# Patient Record
Sex: Male | Born: 1975 | Race: Black or African American | Hispanic: No | Marital: Single
Health system: Southern US, Community
[De-identification: ages and names within clinical notes are randomized; demographics above are authoritative.]

## PROBLEM LIST (undated history)

## (undated) DIAGNOSIS — F1910 Other psychoactive substance abuse, uncomplicated: Secondary | ICD-10-CM

## (undated) DIAGNOSIS — Z8719 Personal history of other diseases of the digestive system: Secondary | ICD-10-CM

## (undated) DIAGNOSIS — N433 Hydrocele, unspecified: Secondary | ICD-10-CM

## (undated) DIAGNOSIS — Z789 Other specified health status: Secondary | ICD-10-CM

## (undated) HISTORY — PX: NO PAST SURGERIES: SHX2092

---

## 1998-03-29 ENCOUNTER — Emergency Department (HOSPITAL_COMMUNITY): Admission: EM | Admit: 1998-03-29 | Discharge: 1998-03-29 | Payer: Self-pay | Admitting: Emergency Medicine

## 2001-03-02 ENCOUNTER — Emergency Department (HOSPITAL_COMMUNITY): Admission: EM | Admit: 2001-03-02 | Discharge: 2001-03-02 | Payer: Self-pay | Admitting: Emergency Medicine

## 2003-04-14 ENCOUNTER — Emergency Department (HOSPITAL_COMMUNITY): Admission: EM | Admit: 2003-04-14 | Discharge: 2003-04-14 | Payer: Self-pay | Admitting: Emergency Medicine

## 2003-06-01 ENCOUNTER — Emergency Department (HOSPITAL_COMMUNITY): Admission: EM | Admit: 2003-06-01 | Discharge: 2003-06-02 | Payer: Self-pay | Admitting: Emergency Medicine

## 2003-06-02 ENCOUNTER — Emergency Department (HOSPITAL_COMMUNITY): Admission: EM | Admit: 2003-06-02 | Discharge: 2003-06-02 | Payer: Self-pay | Admitting: Emergency Medicine

## 2005-12-04 ENCOUNTER — Emergency Department (HOSPITAL_COMMUNITY): Admission: EM | Admit: 2005-12-04 | Discharge: 2005-12-04 | Payer: Self-pay | Admitting: Emergency Medicine

## 2006-06-12 ENCOUNTER — Emergency Department (HOSPITAL_COMMUNITY): Admission: EM | Admit: 2006-06-12 | Discharge: 2006-06-12 | Payer: Self-pay | Admitting: Family Medicine

## 2011-03-30 ENCOUNTER — Emergency Department (HOSPITAL_COMMUNITY)
Admission: EM | Admit: 2011-03-30 | Discharge: 2011-03-30 | Disposition: A | Discharge status: Home / Self Care | Payer: Self-pay | Attending: Emergency Medicine | Admitting: Emergency Medicine

## 2011-03-30 DIAGNOSIS — J3489 Other specified disorders of nose and nasal sinuses: Secondary | ICD-10-CM | POA: Insufficient documentation

## 2011-03-30 DIAGNOSIS — J309 Allergic rhinitis, unspecified: Secondary | ICD-10-CM | POA: Insufficient documentation

## 2011-03-30 DIAGNOSIS — R51 Headache: Secondary | ICD-10-CM | POA: Insufficient documentation

## 2011-03-30 DIAGNOSIS — F1410 Cocaine abuse, uncomplicated: Secondary | ICD-10-CM | POA: Insufficient documentation

## 2011-04-27 ENCOUNTER — Emergency Department (HOSPITAL_COMMUNITY)
Admission: EM | Admit: 2011-04-27 | Discharge: 2011-04-27 | Disposition: A | Discharge status: Home / Self Care | Payer: Self-pay | Attending: Emergency Medicine | Admitting: Emergency Medicine

## 2011-04-27 DIAGNOSIS — L0231 Cutaneous abscess of buttock: Secondary | ICD-10-CM | POA: Insufficient documentation

## 2011-04-27 DIAGNOSIS — L03317 Cellulitis of buttock: Secondary | ICD-10-CM | POA: Insufficient documentation

## 2011-04-27 DIAGNOSIS — IMO0001 Reserved for inherently not codable concepts without codable children: Secondary | ICD-10-CM | POA: Insufficient documentation

## 2013-01-01 ENCOUNTER — Emergency Department (INDEPENDENT_AMBULATORY_CARE_PROVIDER_SITE_OTHER)
Admission: EM | Admit: 2013-01-01 | Discharge: 2013-01-01 | Disposition: A | Discharge status: Home / Self Care | Payer: Self-pay | Source: Home / Self Care | Attending: Family Medicine | Admitting: Family Medicine

## 2013-01-01 ENCOUNTER — Encounter (HOSPITAL_COMMUNITY): Payer: Self-pay

## 2013-01-01 DIAGNOSIS — K2970 Gastritis, unspecified, without bleeding: Secondary | ICD-10-CM

## 2013-01-01 DIAGNOSIS — K644 Residual hemorrhoidal skin tags: Secondary | ICD-10-CM

## 2013-01-01 LAB — OCCULT BLOOD, POC DEVICE: Fecal Occult Bld: NEGATIVE

## 2013-01-01 MED ORDER — OMEPRAZOLE 20 MG PO CPDR: 20.0000 mg | DELAYED_RELEASE_CAPSULE | Freq: Every day | ORAL | Status: DC

## 2013-01-01 NOTE — ED Provider Notes (Signed)
History     CSN: [id number]  Arrival date & time 01/01/13  1026   First MD Initiated Contact with Patient 01/01/13 1153      Chief Complaint  Patient presents with  . Rectal Bleeding    (Consider location/radiation/quality/duration/timing/severity/associated sxs/prior treatment) HPI Comments: 37 year old male with no significant past medical history here complaining of intermittent rectal bleeding when he wipes after stooling during the last week.  Poor historian, states  symptoms associated with abdominal fullness sensation, hard stools initially and then intermittent loose stools and pain in his stomach. Denies nausea, vomiting, denies hematemesis. No melena. No jaundice. Patient reports that he had alcohol intake more than what is usual for him on the days prior he started with diarrhea and abdominal discomfort. He also smokes cigarettes and marijuana daily. Has not taken any medications for his symptoms. Reports normal bowel movements last time yesterday with no blood.  states he feels well today denies  fever, chills, abdominal pain, nausea or vomiting. Denies family history of Crohn's disease or ulcerative colitis. Denies personal history of ulcers.    History reviewed. No pertinent past medical history.  History reviewed. No pertinent past surgical history.  History reviewed. No pertinent family history.  History  Substance Use Topics  . Smoking status: Not on file  . Smokeless tobacco: Not on file  . Alcohol Use: No      Review of Systems  Constitutional: Negative for fever, chills, diaphoresis, activity change, appetite change and fatigue.  Gastrointestinal: Positive for diarrhea, constipation, blood in stool and hematochezia. Negative for nausea, vomiting, abdominal pain and anal bleeding.  Genitourinary: Negative for dysuria and frequency.  Skin: Negative for rash.  Neurological: Negative for dizziness and headaches.  All other systems reviewed and are  negative.    Allergies  Review of patient's allergies indicates no known allergies.  Home Medications   Current Outpatient Rx  Name  Route  Sig  Dispense  Refill  . OMEPRAZOLE 20 MG PO CPDR   Oral   Take 1 capsule (20 mg total) by mouth daily.   30 capsule   0     BP 103/58  Pulse 56  Temp 99 F (37.2 C) (Oral)  Resp 16  Ht 6\' 4"  (1.93 m)  Wt 175 lb (79.379 kg)  BMI 21.30 kg/m2  SpO2 99%  Physical Exam  Nursing note and vitals reviewed. Constitutional: He is oriented to person, place, and time. He appears well-developed and well-nourished. No distress.  HENT:  Head: Normocephalic and atraumatic.  Mouth/Throat: Oropharynx is clear and moist. No oropharyngeal exudate.  Eyes: Conjunctivae normal are normal. No scleral icterus.  Neck: Neck supple. No thyromegaly present.  Cardiovascular: Normal heart sounds.   Pulmonary/Chest: Breath sounds normal.  Abdominal: Soft. Bowel sounds are normal. He exhibits no distension and no mass. There is no rebound and no guarding.       Reported mild discomfort with deep palpation of the epigastric area. Otherwise normal abdominal exam. No hepatosplenomegaly. Abdomen is flat with no distention or ascites  Genitourinary: Guaiac negative stool.       Rectum: External hemorrhoids. No obvious fissures. Rectal sphincter with normal tone. I don't feel rectal mass or internal hemorrhoids. No stools in rectal vault.  Lymphadenopathy:    He has no cervical adenopathy.  Neurological: He is alert and oriented to person, place, and time.  Skin: No rash noted. He is not diaphoretic.    ED Course  Procedures (including critical care time)   Labs  Reviewed  OCCULT BLOOD, POC DEVICE   No results found.   1. Hemorrhoids, external   2. Gastritis       MDM  Normal physical/abdominal exam today other than confirmation of external hemorrhoids.  no obvious rectal fissure.negative guaiac. My impression is that he had irritated external  hemorrhoids and gastritis associated with chronic  constipation. Recommended MiraLax when he has hard stools. Encouraged to quit smoking and avoid alcohol intake.  Supportive care and red flags that should prompt his return to medical attention discussed with patient and provided in writing. Gastroenterologist referral as needed.        Sharin Grave, MD 01/02/13 [phone]

## 2013-01-01 NOTE — ED Notes (Signed)
C/o abdominal area fullness (undetermined # of days), hard stool, and blood from rectal area; NAD

## 2014-05-20 ENCOUNTER — Emergency Department (INDEPENDENT_AMBULATORY_CARE_PROVIDER_SITE_OTHER)
Admission: EM | Admit: 2014-05-20 | Discharge: 2014-05-20 | Disposition: A | Discharge status: Home / Self Care | Payer: Self-pay | Source: Home / Self Care | Attending: Family Medicine | Admitting: Family Medicine

## 2014-05-20 ENCOUNTER — Other Ambulatory Visit (HOSPITAL_COMMUNITY)
Admission: RE | Admit: 2014-05-20 | Discharge: 2014-05-20 | Disposition: A | Discharge status: Home / Self Care | Payer: Self-pay | Source: Ambulatory Visit | Attending: Family Medicine | Admitting: Family Medicine

## 2014-05-20 ENCOUNTER — Encounter (HOSPITAL_COMMUNITY): Payer: Self-pay | Admitting: Emergency Medicine

## 2014-05-20 DIAGNOSIS — Z202 Contact with and (suspected) exposure to infections with a predominantly sexual mode of transmission: Secondary | ICD-10-CM

## 2014-05-20 DIAGNOSIS — Z113 Encounter for screening for infections with a predominantly sexual mode of transmission: Secondary | ICD-10-CM | POA: Insufficient documentation

## 2014-05-20 DIAGNOSIS — N433 Hydrocele, unspecified: Secondary | ICD-10-CM

## 2014-05-20 MED ORDER — CEFTRIAXONE SODIUM 250 MG IJ SOLR
250.0000 mg | Freq: Once | INTRAMUSCULAR | Status: AC
  Administered 2014-05-20: 250 mg via INTRAMUSCULAR

## 2014-05-20 MED ORDER — AZITHROMYCIN 250 MG PO TABS
1000.0000 mg | ORAL_TABLET | Freq: Once | ORAL | Status: AC
  Administered 2014-05-20: 1000 mg via ORAL

## 2014-05-20 MED ORDER — AZITHROMYCIN 250 MG PO TABS
ORAL_TABLET | ORAL | Status: AC
  Filled 2014-05-20: qty 4

## 2014-05-20 MED ORDER — LIDOCAINE HCL (PF) 1 % IJ SOLN
INTRAMUSCULAR | Status: AC
Start: 2014-05-20 — End: 2014-05-20
  Filled 2014-05-20: qty 5

## 2014-05-20 MED ORDER — CEFTRIAXONE SODIUM 250 MG IJ SOLR
INTRAMUSCULAR | Status: AC
  Filled 2014-05-20: qty 250

## 2014-05-20 NOTE — Discharge Instructions (Signed)
Thank you for coming in today. Come back as needed.  I recommend a HIV and syphilis test at the health department in the near future.   Hydrocele, Adult Fluid can collect around the testicles. This fluid forms in a sac. This condition is called a hydrocele. The collected fluid causes swelling of the scrotum. Usually, it affects just one testicle. Most of the time, the condition does not cause pain. Sometimes, the hydrocele goes away on its own. Other times, surgery is needed to get rid of the fluid. CAUSES A hydrocele does not develop often. Different things can cause a hydrocele in a man, including:  Injury to the scrotum.  Infection.  X-ray of the area around the scrotum.  A tumor or cancer of the testicle.  Twisting of a testicle.  Decreased blood flow to the scrotum. SYMPTOMS   Swelling without pain. The hydrocele feels like a water-filled balloon.  Swelling with pain. This can occur if the hydrocele was caused by infection or twisting.  Mild discomfort in the scrotum.  The hydrocele may feel heavy.  Swelling that gets smaller when you lie down. DIAGNOSIS  Your caregiver will do a physical exam to decide if you have a hydrocele. This may include:  Asking questions about your overall health, today and in the past. Your caregiver may ask about any injuries, X-rays, or infections.  Pushing on your abdomen or asking you to change positions to see if the size of the hydrocele changes.  Shining a light through the scrotum (transillumination) to see if the fluid inside the scrotum is clear.  Blood tests and urine tests to check for infection.  Imaging studies that take pictures of the scrotum and testicles. TREATMENT  Treatment depends in part on what caused the condition. Options include:  Watchful waiting. Your caregiver checks the hydrocele every so often.  Different surgeries to drain the fluid.  A needle may be put into the scrotum to drain fluid (needle  aspiration). Fluid often returns after this type of treatment.  A cut (incision) may be made in the scrotum to remove the fluid sac (hydrocelectomy).  An incision may be made in the groin to repair a hydrocele that has contact with abdominal fluids (communicating hydrocele).  Medicines to treat an infection (antibiotics). HOME CARE INSTRUCTIONS  What you need to do at home may depend on the cause of the hydrocele and type of treatment. In general:  Take all medicine as directed by your caregiver. Follow the directions carefully.  Ask your caregiver if there is anything you should not do while you recover (activities, lifting, work, sex).  If you had surgery to repair a communicating hydrocele, recovery time may vary. Ask you caregiver about your recovery time.  Avoid heavy lifting for 4 to 6 weeks.  If you had an incision on the scrotum or groin, wash it for 2 to 3 days after surgery. Do this as long as the skin is closed and there are no gaps in the wound. Wash gently, and avoid rubbing the incision.  Keep all follow-up appointments. SEEK MEDICAL CARE IF:   Your scrotum seems to be getting larger.  The area becomes more and more uncomfortable. SEEK IMMEDIATE MEDICAL CARE IF:  You have a fever. Document Released: 05/03/2010 Document Revised: 09/03/2013 Document Reviewed: 05/03/2010 [facility] Patient Information 2015 [place], [place]. This information is not intended to replace advice given to you by your health care provider. Make sure you discuss any questions you have with your health  care provider.  Sexually Transmitted Disease A sexually transmitted disease (STD) is a disease or infection that may be passed (transmitted) from person to person, usually during sexual activity. This may happen by way of saliva, semen, blood, vaginal mucus, or urine. Common STDs include:   Gonorrhea.   Chlamydia.   Syphilis.   HIV and AIDS.   Genital herpes.   Hepatitis B and C.    Trichomonas.   Human papillomavirus (HPV).   Pubic lice.   Scabies.  Mites.  Bacterial vaginosis. WHAT ARE CAUSES OF STDs? An STD may be caused by bacteria, a virus, or parasites. STDs are often transmitted during sexual activity if one person is infected. However, they may also be transmitted through nonsexual means. STDs may be transmitted after:   Sexual intercourse with an infected person.   Sharing sex toys with an infected person.   Sharing needles with an infected person or using unclean piercing or tattoo needles.  Having intimate contact with the genitals, mouth, or rectal areas of an infected person.   Exposure to infected fluids during birth. WHAT ARE THE SIGNS AND SYMPTOMS OF STDs? Different STDs have different symptoms. Some people may not have any symptoms. If symptoms are present, they may include:   Painful or bloody urination.   Pain in the pelvis, abdomen, vagina, anus, throat, or eyes.   A skin rash, itching, or irritation.  Growths, ulcerations, blisters, or sores in the genital and anal areas.  Abnormal vaginal discharge with or without bad odor.   Penile discharge in men.   Fever.   Pain or bleeding during sexual intercourse.   Swollen glands in the groin area.   Yellow skin and eyes (jaundice). This is seen with hepatitis.   Swollen testicles.  Infertility.  Sores and blisters in the mouth. HOW ARE STDs DIAGNOSED? To make a diagnosis, your health care provider may:   Take a medical history.   Perform a physical exam.   Take a sample of any discharge to examine.  Swab the throat, cervix, opening to the penis, rectum, or vagina for testing.  Test a sample of your first morning urine.   Perform blood tests.   Perform a Pap test, if this applies.   Perform a colposcopy.   Perform a laparoscopy.  HOW ARE STDs TREATED? Treatment depends on the STD. Some STDs may be treated but not cured.   Chlamydia,  gonorrhea, trichomonas, and syphilis can be cured with antibiotic medicine.   Genital herpes, hepatitis, and HIV can be treated, but not cured, with prescribed medicines. The medicines lessen symptoms.   Genital warts from HPV can be treated with medicine or by freezing, burning (electrocautery), or surgery. Warts may come back.   HPV cannot be cured with medicine or surgery. However, abnormal areas may be removed from the cervix, vagina, or vulva.   If your diagnosis is confirmed, your recent sexual partners need treatment. This is true even if they are symptom-free or have a negative culture or evaluation. They should not have sex until their health care providers say it is okay. HOW CAN I REDUCE MY RISK OF GETTING AN STD? Take these steps to reduce your risk of getting an STD:  Use latex condoms, dental dams, and water-soluble lubricants during sexual activity. Do not use petroleum jelly or oils.  Avoid having multiple sex partners.  Do not have sex with someone who has other sex partners.  Do not have sex with anyone you do not know  or who is at high risk for an STD.  Avoid risky sex practices that can break your skin.  Do not have sex if you have open sores on your mouth or skin.  Avoid drinking too much alcohol or taking illegal drugs. Alcohol and drugs can affect your judgment and put you in a vulnerable position.  Avoid engaging in oral and anal sex acts.  Get vaccinated for HPV and hepatitis. If you have not received these vaccines in the past, talk to your health care provider about whether one or both might be right for you.   If you are at risk of being infected with HIV, it is recommended that you take a prescription medicine daily to prevent HIV infection. This is called pre-exposure prophylaxis (PrEP). You are considered at risk if:  You are a man who has sex with other men (MSM).  You are a heterosexual man or woman and are sexually active with more than one  partner.  You take drugs by injection.  You are sexually active with a partner who has HIV.  Talk with your health care provider about whether you are at high risk of being infected with HIV. If you choose to begin PrEP, you should first be tested for HIV. You should then be tested every 3 months for as long as you are taking PrEP.  WHAT SHOULD I DO IF I THINK I HAVE AN STD?  See your health care provider.   Tell your sexual partner(s). They should be tested and treated for any STDs.  Do not have sex until your health care provider says it is okay. WHEN SHOULD I GET IMMEDIATE MEDICAL CARE? Contact your health care provider right away if:   You have severe abdominal pain.  You are a man and notice swelling or pain in your testicles.  You are a woman and notice swelling or pain in your vagina. Document Released: 02/03/2003 Document Revised: 11/18/2013 Document Reviewed: 06/03/2013 [facility] Patient Information 2015 [place], [place]. This information is not intended to replace advice given to you by your health care provider. Make sure you discuss any questions you have with your health care provider.

## 2014-05-20 NOTE — ED Notes (Signed)
Reports possible exposure to std.  Requesting std check.  Pt is asymptomatic.

## 2014-05-20 NOTE — ED Provider Notes (Signed)
Edward BeckwithRichard L Bond is a 38 y.o. male who presents to Urgent Care today for exposure to STD. Patient was informed that a sexual partner of his was tested positive for Chlamydia. He denies any symptoms and feels well otherwise. No discharge fevers chills nausea vomiting or diarrhea. He feels well otherwise.   History reviewed. No pertinent past medical history. History  Substance Use Topics  . Smoking status: Current Every Day Smoker  . Smokeless tobacco: Not on file  . Alcohol Use: No   ROS as above Medications: No current facility-administered medications for this encounter.   Current Outpatient Prescriptions  Medication Sig Dispense Refill  . omeprazole (PRILOSEC) 20 MG capsule Take 1 capsule (20 mg total) by mouth daily.  30 capsule  0    Exam:  BP 115/67  Pulse 55  Temp(Src) 98.4 F (36.9 C) (Oral)  Resp 16  SpO2 100% Gen: Well NAD Genital: No inguinal lymphadenopathy. Testicles are descended bilaterally and nontender with no masses. Right hydrocele is present. Penis is normal appearing and circumcised. No lesions or discharge.  No results found for this or any previous visit (from the past 24 hour(s)). No results found.  Assessment and Plan: 38 y.o. male with hydrocele with STD exposure. Watchful waiting for hydrocele. Urine cytology pending. Empiric treatment with azithromycin and ceftriaxone. Patient declined HIV and syphilis testing.  Discussed warning signs or symptoms. Please see discharge instructions. Patient expresses understanding.    Edward BongEvan S Corey, MD 05/20/14 [phone]

## 2017-01-29 ENCOUNTER — Ambulatory Visit (HOSPITAL_COMMUNITY)
Admission: EM | Admit: 2017-01-29 | Discharge: 2017-01-29 | Disposition: A | Discharge status: Home / Self Care | Payer: Self-pay | Attending: Internal Medicine | Admitting: Internal Medicine

## 2017-01-29 ENCOUNTER — Encounter (HOSPITAL_COMMUNITY): Payer: Self-pay | Admitting: Family Medicine

## 2017-01-29 DIAGNOSIS — L0291 Cutaneous abscess, unspecified: Secondary | ICD-10-CM

## 2017-01-29 MED ORDER — ONDANSETRON 4 MG PO TBDP
4.0000 mg | ORAL_TABLET | Freq: Once | ORAL | Status: AC
  Administered 2017-01-29: 4 mg via ORAL

## 2017-01-29 MED ORDER — ACETAMINOPHEN-CODEINE #3 300-30 MG PO TABS: 1.0000 | ORAL_TABLET | Freq: Four times a day (QID) | ORAL | 0 refills | Status: DC | PRN

## 2017-01-29 MED ORDER — ONDANSETRON 4 MG PO TBDP
ORAL_TABLET | ORAL | Status: AC
  Filled 2017-01-29: qty 1

## 2017-01-29 MED ORDER — SULFAMETHOXAZOLE-TRIMETHOPRIM 800-160 MG PO TABS: 1.0000 | ORAL_TABLET | Freq: Two times a day (BID) | ORAL | 0 refills | Status: AC

## 2017-01-29 MED ORDER — IBUPROFEN 800 MG PO TABS: 800.0000 mg | ORAL_TABLET | Freq: Three times a day (TID) | ORAL | 0 refills | Status: DC

## 2017-01-29 MED ORDER — CEPHALEXIN 500 MG PO CAPS: 500.0000 mg | ORAL_CAPSULE | Freq: Four times a day (QID) | ORAL | 0 refills | Status: DC

## 2017-01-29 NOTE — Discharge Instructions (Signed)
Your abscess has been drained and bandaged in clinic. I prescribed 2 different antibiotics take Keflex 1 tablet 4 times a day for 5 days, the other is Bactrim, take one tablet twice a day for 7 days. If you show any signs or symptoms of infection, follow up with primary care, return to clinic, or go to the emergency room. For pain, prescribed ibuprofen 800 mg take one tablet every 8 hours. For breakthrough pain I have prescribed Tylenol with codeine No. 3. This is a narcotic, do not drink any alcohol or drive while taking.

## 2017-01-29 NOTE — ED Triage Notes (Signed)
Pt here for abscess to right bicep area.

## 2017-01-29 NOTE — ED Provider Notes (Signed)
CSN: [id number]     Arrival date & time 01/29/17  1617 History   None    Chief Complaint  Patient presents with  . Abscess   (Consider location/radiation/quality/duration/timing/severity/associated sxs/prior Treatment) Review 41-year-old male presents to clinic with a chief complaint of an abscess to his upper right arm. States it been present for 3 days, states she has expressed it at home, and has had. Went drainage. He denies fever nausea, chills, or other markers of systemic infection.   The history is provided by the patient.  Abscess    History reviewed. No pertinent past medical history. History reviewed. No pertinent surgical history. History reviewed. No pertinent family history. Social History  Substance Use Topics  . Smoking status: Current Every Day Smoker  . Smokeless tobacco: Never Used  . Alcohol use No    Review of Systems  Reason unable to perform ROS: As covered in history of present illness.  All other systems reviewed and are negative.   Allergies  Patient has no known allergies.  Home Medications   Prior to Admission medications   Medication Sig Start Date End Date Taking? Authorizing Provider  acetaminophen-codeine (TYLENOL #3) 300-30 MG tablet Take 1-2 tablets by mouth every 6 (six) hours as needed for moderate pain. 01/29/17   Dorena BodoLawrence Tomasa Dobransky, NP  cephALEXin (KEFLEX) 500 MG capsule Take 1 capsule (500 mg total) by mouth 4 (four) times daily. 01/29/17   Dorena BodoLawrence Fallynn Gravett, NP  ibuprofen (ADVIL,MOTRIN) 800 MG tablet Take 1 tablet (800 mg total) by mouth 3 (three) times daily. 01/29/17   Dorena BodoLawrence Jayko Voorhees, NP  omeprazole (PRILOSEC) 20 MG capsule Take 1 capsule (20 mg total) by mouth daily. 01/01/13   Adlih Moreno-Coll, MD  sulfamethoxazole-trimethoprim (BACTRIM DS,SEPTRA DS) 800-160 MG tablet Take 1 tablet by mouth 2 (two) times daily. 01/29/17 02/05/17  Dorena BodoLawrence Preeya Cleckley, NP   Meds Ordered and Administered this Visit   Medications  ondansetron (ZOFRAN-ODT)  disintegrating tablet 4 mg (4 mg Oral Given 01/29/17 1723)    BP 117/78 (BP Location: Left Arm)   Pulse 101   Temp 98.4 F (36.9 C) (Oral)   Resp 14   SpO2 100%  No data found.   Physical Exam  Constitutional: He is oriented to person, place, and time. He appears well-developed and well-nourished. No distress.  HENT:  Head: Normocephalic and atraumatic.  Right Ear: External ear normal.  Left Ear: External ear normal.  Cardiovascular: Normal rate and regular rhythm.   Pulmonary/Chest: Effort normal and breath sounds normal.  Neurological: He is alert and oriented to person, place, and time.  Skin: Skin is warm and dry. Capillary refill takes less than 2 seconds. He is not diaphoretic.  Approximately 2 cm x 3 cm abscess on the upper arm approximately 6 inches proximal to the elbow.  Psychiatric: He has a normal mood and affect.  Nursing note and vitals reviewed.   Urgent Care Course     .Marland Kitchen.Incision and Drainage Date/Time: 01/29/2017 5:22 PM Performed by: Dorena BodoKENNARD, Louisa Favaro Authorized by: Eustace MooreMURRAY, LAURA W   Consent:    Consent obtained:  Verbal   Consent given by:  Patient   Risks discussed:  Bleeding, incomplete drainage, pain and infection   Alternatives discussed:  No treatment, delayed treatment and alternative treatment Location:    Type:  Abscess   Size:  3 cc   Location:  Upper extremity   Upper extremity location:  Arm   Arm location:  R upper arm Pre-procedure details:    Skin preparation:  Betadine Anesthesia (see MAR for exact dosages):    Anesthesia method:  Local infiltration   Local anesthetic:  Lidocaine 2% WITH epi Procedure type:    Complexity:  Simple Procedure details:    Needle aspiration: yes     Needle size:  22 G   Incision types:  Single straight   Incision depth:  Subcutaneous   Scalpel blade:  11   Wound management:  Probed and deloculated, irrigated with saline and debrided   Drainage:  Purulent   Drainage amount:  Moderate   Wound  treatment:  Wound left open   Packing materials:  None Post-procedure details:    Patient tolerance of procedure:  Tolerated well, no immediate complications   (including critical care time)  Labs Review Labs Reviewed - No data to display  Imaging Review No results found.     MDM   1. Abscess    Your abscess has been drained and bandaged in clinic. I prescribed 2 different antibiotics take Keflex 1 tablet 4 times a day for 5 days, the other is Bactrim, take one tablet twice a day for 7 days. If you show any signs or symptoms of infection, follow up with primary care, return to clinic, or go to the emergency room. For pain, prescribed ibuprofen 800 mg take one tablet every 8 hours. For breakthrough pain I have prescribed Tylenol with codeine No. 3. This is a narcotic, do not drink any alcohol or drive while taking.     Dorena Bodo, NP 01/29/17 1726

## 2017-03-25 ENCOUNTER — Ambulatory Visit (HOSPITAL_COMMUNITY)
Admission: EM | Admit: 2017-03-25 | Discharge: 2017-03-25 | Disposition: A | Discharge status: Home / Self Care | Payer: Self-pay | Attending: Internal Medicine | Admitting: Internal Medicine

## 2017-03-25 ENCOUNTER — Encounter (HOSPITAL_COMMUNITY): Payer: Self-pay | Admitting: Emergency Medicine

## 2017-03-25 DIAGNOSIS — B9562 Methicillin resistant Staphylococcus aureus infection as the cause of diseases classified elsewhere: Secondary | ICD-10-CM | POA: Insufficient documentation

## 2017-03-25 DIAGNOSIS — L0211 Cutaneous abscess of neck: Secondary | ICD-10-CM | POA: Insufficient documentation

## 2017-03-25 DIAGNOSIS — L0291 Cutaneous abscess, unspecified: Secondary | ICD-10-CM

## 2017-03-25 MED ORDER — CEFTRIAXONE SODIUM 1 G IJ SOLR
INTRAMUSCULAR | Status: AC
  Filled 2017-03-25: qty 10

## 2017-03-25 MED ORDER — CEFTRIAXONE SODIUM 1 G IJ SOLR
1.0000 g | Freq: Once | INTRAMUSCULAR | Status: AC
  Administered 2017-03-25: 1 g via INTRAMUSCULAR

## 2017-03-25 MED ORDER — HYDROCODONE-ACETAMINOPHEN 5-325 MG PO TABS: 1.0000 | ORAL_TABLET | Freq: Four times a day (QID) | ORAL | 0 refills | Status: DC | PRN

## 2017-03-25 MED ORDER — LIDOCAINE HCL (PF) 1 % IJ SOLN
INTRAMUSCULAR | Status: AC
  Filled 2017-03-25: qty 2

## 2017-03-25 MED ORDER — SULFAMETHOXAZOLE-TRIMETHOPRIM 800-160 MG PO TABS: 1.0000 | ORAL_TABLET | Freq: Two times a day (BID) | ORAL | 0 refills | Status: AC

## 2017-03-25 NOTE — Discharge Instructions (Signed)
Your abscess has been drained in clinic, samples of been sent to lab for sensitivity, you've been given antibiotic in clinic, and I prescribed a second antibiotic to take at home. Take one tablet of Bactrim twice a day. Keep your wound clean, dry, covered, change the dressing at least once daily. If there are any signs of infection, return to clinic. I have also prescribed a medicine for pain called hydrocodone, this medicine is a narcotic, it will cause drowsiness, and it is addictive. Do not take more than what is necessary, do not drink alcohol while taking, and do not operate any heavy machinery while taking this medicine.

## 2017-03-25 NOTE — ED Provider Notes (Signed)
CSN: [id number]     Arrival date & time 03/25/17  1259 History   First MD Initiated Contact with Patient 03/25/17 1534     Chief Complaint  Patient presents with  . Abscess   (Consider location/radiation/quality/duration/timing/severity/associated sxs/prior Treatment) The history is provided by the patient.  Abscess  Location:  Head/neck Head/neck abscess location:  L neck Size:  2 cm Abscess quality: draining, fluctuance, painful, redness and warmth   Red streaking: no   Duration:  1 week Progression:  Worsening Pain details:    Quality:  Pressure and throbbing   Severity:  Moderate   Duration:  1 week   Timing:  Constant   Progression:  Worsening Chronicity:  New Context: not diabetes, not immunosuppression and not skin injury   Relieved by:  Draining/squeezing and warm compresses Ineffective treatments:  Warm compresses Associated symptoms: no anorexia, no fatigue, no fever, no headaches, no nausea and no vomiting   Risk factors: prior abscess     History reviewed. No pertinent past medical history. History reviewed. No pertinent surgical history. History reviewed. No pertinent family history. Social History  Substance Use Topics  . Smoking status: Current Every Day Smoker  . Smokeless tobacco: Never Used  . Alcohol use No    Review of Systems  Constitutional: Negative for chills, fatigue and fever.  Respiratory: Negative.   Cardiovascular: Negative.   Gastrointestinal: Negative for anorexia, diarrhea, nausea and vomiting.  Musculoskeletal: Negative.   Skin: Positive for color change and wound.  Neurological: Negative for dizziness and headaches.    Allergies  Patient has no known allergies.  Home Medications   Prior to Admission medications   Medication Sig Start Date End Date Taking? Authorizing Provider  HYDROcodone-acetaminophen (NORCO/VICODIN) 5-325 MG tablet Take 1 tablet by mouth every 6 (six) hours as needed. 03/25/17   Dorena Bodo, NP    sulfamethoxazole-trimethoprim (BACTRIM DS,SEPTRA DS) 800-160 MG tablet Take 1 tablet by mouth 2 (two) times daily. 03/25/17 04/01/17  Dorena Bodo, NP   Meds Ordered and Administered this Visit   Medications  cefTRIAXone (ROCEPHIN) injection 1 g (1 g Intramuscular Given 03/25/17 1609)    BP 110/74 (BP Location: Right Arm)   Pulse (!) 53 Comment: notified rn  Temp 98.8 F (37.1 C) (Oral)   Resp 14   SpO2 100%  No data found.   Physical Exam  Constitutional: He is oriented to person, place, and time. He appears well-developed and well-nourished. No distress.  HENT:  Head: Normocephalic and atraumatic.    Right Ear: External ear normal.  Left Ear: External ear normal.  Eyes: Conjunctivae are normal. Right eye exhibits no discharge. Left eye exhibits no discharge.  Neck: Normal range of motion.  Cardiovascular: Normal rate and regular rhythm.   Pulmonary/Chest: Effort normal and breath sounds normal.  Lymphadenopathy:    He has no cervical adenopathy.  Neurological: He is alert and oriented to person, place, and time.  Skin: Skin is warm and dry. Capillary refill takes less than 2 seconds. He is not diaphoretic.  Psychiatric: He has a normal mood and affect. His behavior is normal.  Nursing note and vitals reviewed.   Urgent Care Course     .Marland KitchenIncision and Drainage Date/Time: 03/25/2017 3:58 PM Performed by: Dorena Bodo Authorized by: Eustace Moore   Consent:    Consent obtained:  Verbal   Consent given by:  Patient   Risks discussed:  Bleeding, incomplete drainage, pain and infection   Alternatives discussed:  No treatment and  alternative treatment Location:    Type:  Abscess   Size:  2 cm   Location:  Neck   Neck location:  L posterior Pre-procedure details:    Skin preparation:  Betadine Anesthesia (see MAR for exact dosages):    Anesthesia method:  Local infiltration   Local anesthetic:  Lidocaine 2% WITH epi Procedure type:    Complexity:   Simple Procedure details:    Needle aspiration: no     Incision types:  Stab incision   Incision depth:  Dermal   Scalpel blade:  11   Wound management:  Probed and deloculated and irrigated with saline   Drainage:  Purulent   Drainage amount:  Moderate   Wound treatment:  Wound left open   Packing materials:  None Post-procedure details:    Patient tolerance of procedure:  Tolerated well, no immediate complications    (including critical care time)  Labs Review Labs Reviewed  AEROBIC/ANAEROBIC CULTURE (SURGICAL/DEEP WOUND)    Imaging Review No results found.       MDM   1. Abscess    Incision and drainage performed, aerobic and anaerobic cultures obtained, patient given Keflex in clinic, discharged home on Bactrim, given hydrocodone for pain management, and also provided a work note. Counseling on wound care provided, follow-up with primary care return to clinic as needed, go to the emergency room any time if it worsens or if there are any symptoms of infection.     Dorena Bodo, NP 03/25/17 2207

## 2017-03-25 NOTE — ED Triage Notes (Signed)
The patient presented to the [facility] with a possible abscess on the back of his neck x 1 week.

## 2017-03-30 LAB — AEROBIC/ANAEROBIC CULTURE W GRAM STAIN (SURGICAL/DEEP WOUND)

## 2017-03-30 LAB — AEROBIC/ANAEROBIC CULTURE (SURGICAL/DEEP WOUND)

## 2017-04-26 ENCOUNTER — Encounter (HOSPITAL_COMMUNITY): Payer: Self-pay | Admitting: Emergency Medicine

## 2017-04-26 ENCOUNTER — Ambulatory Visit (HOSPITAL_COMMUNITY)
Admission: EM | Admit: 2017-04-26 | Discharge: 2017-04-26 | Disposition: A | Discharge status: Home / Self Care | Payer: Self-pay | Attending: Internal Medicine | Admitting: Internal Medicine

## 2017-04-26 DIAGNOSIS — K4090 Unilateral inguinal hernia, without obstruction or gangrene, not specified as recurrent: Secondary | ICD-10-CM

## 2017-04-26 MED ORDER — DOCUSATE SODIUM 100 MG PO CAPS: 100.0000 mg | ORAL_CAPSULE | Freq: Two times a day (BID) | ORAL | 0 refills | Status: DC

## 2017-04-26 NOTE — ED Triage Notes (Addendum)
Denies painful urination.  Patient has testicular swelling.  Patient has noticed swelling for 2 months.  Reports size is a tennis ball for 2 months

## 2017-04-26 NOTE — Discharge Instructions (Signed)
If you develop any severe pain or more severe swelling in the testicular area will need to go to the Emergency Room.

## 2017-04-26 NOTE — ED Provider Notes (Signed)
CSN: [id number]     Arrival date & time 04/26/17  1156 History   None    Chief Complaint  Patient presents with  . Groin Swelling   (Consider location/radiation/quality/duration/timing/severity/associated sxs/prior Treatment) Patient c/o right testicular swelling and some right groin discomfort.  He states he has had this for 2 months and it is getting larger.   The history is provided by the patient.  Testicle Pain  This is a new problem. The current episode started more than 1 week ago. The problem occurs constantly. The problem has been gradually worsening. Nothing aggravates the symptoms. Nothing relieves the symptoms. He has tried nothing for the symptoms.    History reviewed. No pertinent past medical history. History reviewed. No pertinent surgical history. No family history on file. Social History  Substance Use Topics  . Smoking status: Current Every Day Smoker  . Smokeless tobacco: Never Used  . Alcohol use No    Review of Systems  Constitutional: Negative.   HENT: Negative.   Eyes: Negative.   Respiratory: Negative.   Cardiovascular: Negative.   Gastrointestinal: Negative.   Endocrine: Negative.   Genitourinary: Positive for testicular pain.  Musculoskeletal: Negative.   Skin: Negative.   Allergic/Immunologic: Negative.   Neurological: Negative.   Hematological: Negative.   Psychiatric/Behavioral: Negative.     Allergies  Patient has no known allergies.  Home Medications   Prior to Admission medications   Medication Sig Start Date End Date Taking? Authorizing Provider  docusate sodium (COLACE) 100 MG capsule Take 1 capsule (100 mg total) by mouth every 12 (twelve) hours. 04/26/17   Deatra Canter, FNP  HYDROcodone-acetaminophen (NORCO/VICODIN) 5-325 MG tablet Take 1 tablet by mouth every 6 (six) hours as needed. 03/25/17   Dorena Bodo, NP   Meds Ordered and Administered this Visit  Medications - No data to display  BP 109/72 (BP Location: Left  Arm)   Pulse 82   Temp 99.1 F (37.3 C) (Oral)   Resp 18   SpO2 96%  No data found.   Physical Exam  Constitutional: He appears well-developed and well-nourished.  HENT:  Head: Normocephalic and atraumatic.  Eyes: Conjunctivae and EOM are normal. Pupils are equal, round, and reactive to light.  Neck: Normal range of motion. Neck supple.  Cardiovascular: Normal rate, regular rhythm and normal heart sounds.   Pulmonary/Chest: Effort normal and breath sounds normal.  Abdominal: Soft. Bowel sounds are normal.  Genitourinary:  Genitourinary Comments: Right inguinal hernia extends into testicle which extends over to left testicle and size is approximately 24 cm long and 12 cm wide.  Peristalsis and bowel sounds appreciated in testes and testes soft and no incarceration. Hernia does not reduce when patient lying flat.  Nursing note and vitals reviewed.   Urgent Care Course     Procedures (including critical care time)  Labs Review Labs Reviewed - No data to display  Imaging Review No results found.   Visual Acuity Review  Right Eye Distance:   Left Eye Distance:   Bilateral Distance:    Right Eye Near:   Left Eye Near:    Bilateral Near:         MDM   1. Right inguinal hernia    Called and spoke with Dr. Andrey Campanile General Surgery On call and explained patient history and exam and Dr. Andrey Campanile recommended going outpatient.  Referal to [facility].  Called and they will call patient with an appointment.  Explained to patient that if he develops  any severe abdominal or testicular pain then will need to go to ED.  Colace 100mg  po bid #60 Push po fluids and warned patient not to get constipated.      Deatra CanterOxford, Emaad Nanna J, [place]FNP 04/26/17 1312

## 2017-07-18 ENCOUNTER — Emergency Department (HOSPITAL_COMMUNITY)
Admission: EM | Admit: 2017-07-18 | Discharge: 2017-07-18 | Discharge status: Against Medical Advice | Payer: Self-pay | Attending: Emergency Medicine | Admitting: Emergency Medicine

## 2017-07-18 DIAGNOSIS — G8929 Other chronic pain: Secondary | ICD-10-CM | POA: Insufficient documentation

## 2017-07-18 LAB — CBC
HCT: 41 % (ref 39.0–52.0)
Hemoglobin: 14.1 g/dL (ref 13.0–17.0)
MCH: 31.4 pg (ref 26.0–34.0)
MCHC: 34.4 g/dL (ref 30.0–36.0)
MCV: 91.3 fL (ref 78.0–100.0)
PLATELETS: 276 10*3/uL (ref 150–400)
RBC: 4.49 MIL/uL (ref 4.22–5.81)
RDW: 13.6 % (ref 11.5–15.5)
WBC: 7.1 10*3/uL (ref 4.0–10.5)

## 2017-07-18 LAB — COMPREHENSIVE METABOLIC PANEL
ALK PHOS: 36 U/L — AB (ref 38–126)
ALT: 17 U/L (ref 17–63)
AST: 30 U/L (ref 15–41)
Albumin: 4.2 g/dL (ref 3.5–5.0)
Anion gap: 9 (ref 5–15)
BUN: 7 mg/dL (ref 6–20)
CALCIUM: 9 mg/dL (ref 8.9–10.3)
CO2: 23 mmol/L (ref 22–32)
CREATININE: 1.09 mg/dL (ref 0.61–1.24)
Chloride: 106 mmol/L (ref 101–111)
GFR calc non Af Amer: >60 mL/min (ref >60)
GLUCOSE: 140 mg/dL — AB (ref 65–99)
Potassium: 3.7 mmol/L (ref 3.5–5.1)
SODIUM: 138 mmol/L (ref 135–145)
Total Bilirubin: 0.8 mg/dL (ref 0.3–1.2)
Total Protein: 7 g/dL (ref 6.5–8.1)

## 2017-07-18 LAB — URINALYSIS, ROUTINE W REFLEX MICROSCOPIC
BILIRUBIN URINE: NEGATIVE
GLUCOSE, UA: NEGATIVE mg/dL
HGB URINE DIPSTICK: NEGATIVE
KETONES UR: 5 mg/dL — AB
LEUKOCYTES UA: NEGATIVE
NITRITE: NEGATIVE
PROTEIN: 30 mg/dL — AB
Specific Gravity, Urine: 1.027 (ref 1.005–1.030)
pH: 5 (ref 5.0–8.0)

## 2017-07-18 NOTE — ED Notes (Signed)
Pt walked out of emergency room stating "f**k this im leaving ill come back later" and threw BP cuff. When I attempted to stop pt he did not stop

## 2017-07-18 NOTE — ED Triage Notes (Signed)
Pt arrives via POV from home with several months of hernia pain. Pt reports tried to follow up with specialist but unable to afford payment. Pt awake, alert, VSS, NAD at present.

## 2017-07-19 ENCOUNTER — Emergency Department (HOSPITAL_COMMUNITY): Payer: Self-pay

## 2017-07-19 ENCOUNTER — Emergency Department (HOSPITAL_COMMUNITY)
Admission: EM | Admit: 2017-07-19 | Discharge: 2017-07-19 | Disposition: A | Discharge status: Home / Self Care | Payer: Self-pay | Attending: Emergency Medicine | Admitting: Emergency Medicine

## 2017-07-19 ENCOUNTER — Encounter (HOSPITAL_COMMUNITY): Payer: Self-pay

## 2017-07-19 DIAGNOSIS — F17200 Nicotine dependence, unspecified, uncomplicated: Secondary | ICD-10-CM | POA: Insufficient documentation

## 2017-07-19 DIAGNOSIS — Z79899 Other long term (current) drug therapy: Secondary | ICD-10-CM | POA: Insufficient documentation

## 2017-07-19 DIAGNOSIS — N50811 Right testicular pain: Secondary | ICD-10-CM

## 2017-07-19 DIAGNOSIS — N433 Hydrocele, unspecified: Secondary | ICD-10-CM | POA: Insufficient documentation

## 2017-07-19 DIAGNOSIS — N5089 Other specified disorders of the male genital organs: Secondary | ICD-10-CM | POA: Insufficient documentation

## 2017-07-19 LAB — URINALYSIS, ROUTINE W REFLEX MICROSCOPIC
Bilirubin Urine: NEGATIVE
GLUCOSE, UA: NEGATIVE mg/dL
HGB URINE DIPSTICK: NEGATIVE
Ketones, ur: NEGATIVE mg/dL
LEUKOCYTES UA: NEGATIVE
NITRITE: NEGATIVE
PH: 6 (ref 5.0–8.0)
PROTEIN: 30 mg/dL — AB
Specific Gravity, Urine: 1.03 (ref 1.005–1.030)

## 2017-07-19 LAB — CBC WITH DIFFERENTIAL/PLATELET
BASOS PCT: 0 %
Basophils Absolute: 0 10*3/uL (ref 0.0–0.1)
EOS ABS: 0.1 10*3/uL (ref 0.0–0.7)
Eosinophils Relative: 3 %
HCT: 41.6 % (ref 39.0–52.0)
HEMOGLOBIN: 14.4 g/dL (ref 13.0–17.0)
Lymphocytes Relative: 39 %
Lymphs Abs: 2.2 10*3/uL (ref 0.7–4.0)
MCH: 31.9 pg (ref 26.0–34.0)
MCHC: 34.6 g/dL (ref 30.0–36.0)
MCV: 92 fL (ref 78.0–100.0)
Monocytes Absolute: 0.5 10*3/uL (ref 0.1–1.0)
Monocytes Relative: 9 %
NEUTROS ABS: 2.8 10*3/uL (ref 1.7–7.7)
NEUTROS PCT: 49 %
Platelets: 267 10*3/uL (ref 150–400)
RBC: 4.52 MIL/uL (ref 4.22–5.81)
RDW: 13.6 % (ref 11.5–15.5)
WBC: 5.7 10*3/uL (ref 4.0–10.5)

## 2017-07-19 LAB — COMPREHENSIVE METABOLIC PANEL
ALBUMIN: 3.8 g/dL (ref 3.5–5.0)
ALK PHOS: 38 U/L (ref 38–126)
ALT: 14 U/L — AB (ref 17–63)
AST: 21 U/L (ref 15–41)
Anion gap: 6 (ref 5–15)
BUN: 8 mg/dL (ref 6–20)
CALCIUM: 8.8 mg/dL — AB (ref 8.9–10.3)
CO2: 27 mmol/L (ref 22–32)
Chloride: 107 mmol/L (ref 101–111)
Creatinine, Ser: 1.03 mg/dL (ref 0.61–1.24)
GFR calc Af Amer: >60 mL/min (ref >60)
GFR calc non Af Amer: >60 mL/min (ref >60)
Glucose, Bld: 106 mg/dL — ABNORMAL HIGH (ref 65–99)
Potassium: 4.1 mmol/L (ref 3.5–5.1)
SODIUM: 140 mmol/L (ref 135–145)
Total Bilirubin: 0.5 mg/dL (ref 0.3–1.2)
Total Protein: 6.7 g/dL (ref 6.5–8.1)

## 2017-07-19 NOTE — ED Triage Notes (Signed)
Pt presents for evaluation of hernia pain x several months. Pt reports was here last night for same but couldn't wait because he was hungry. Pt unable to verbalize where pain is located. Pt alert and oriented, ambulatory.

## 2017-07-19 NOTE — ED Provider Notes (Signed)
[facility]-EMERGENCY DEPT Provider Note   CSN: [id number] Arrival date & time: 07/19/17  4098     History   Chief Complaint Chief Complaint  Patient presents with  . Hernia    HPI Edward Bond is a 41 y.o. male who presents to the ED with complaints of several months of gradually worsening right groin pain that he was told was a hernia. He noticed over the last 1 month the pain and swelling has worsened so he presented for evaluation. Chart review reveals that he was seen at [facility] on 04/26/17 and noted to have what they believed was a large R inguinal hernia which was not reducible and had bowel sounds present, however the provider spoke with Dr. Andrey Campanile who stated he could be followed up outpatient. He was started on colace and given explicit return precautions. Pt states that he missed the appointment with [facility] due to financial reasons, and never went back to another provider until now. He reports that his right groin has become so swollen that he often times sits on the mass because it is so large. He describes the pain as 8/10 constant sharp nonradiating right groin pain that worsens with movement or activity, and he has not tried anything for his symptoms. He has also noticed a weakened urinary stream recently. He endorses having daily soft bowel movements usually after he eats food, and denies any changes in his bowel activity. He does not currently have a PCP. He is sexually active with 2 male partners, unprotected. NKDA. His last meal was breakfast around 9 AM. He denies fevers, chills, CP, SOB, abd pain, N/V/D/C, rectal pain, melena, hematochezia, obstipation, hematuria, dysuria, urinary frequency/urgency, urinary hesitancy, dribbling, penile discharge, myalgias, arthralgias, numbness, tingling, focal weakness, or any other complaints at this time.    The history is provided by the patient and medical records. No language interpreter was used.  Testicle Pain  This is a chronic problem. The  current episode started more than 1 week ago. The problem occurs constantly. The problem has been gradually worsening. Pertinent negatives include no chest pain, no abdominal pain and no shortness of breath. Exacerbated by: movement. Nothing relieves the symptoms. He has tried nothing for the symptoms. The treatment provided no relief.    History reviewed. No pertinent past medical history.  There are no active problems to display for this patient.   History reviewed. No pertinent surgical history.     Home Medications    Prior to Admission medications   Medication Sig Start Date End Date Taking? Authorizing Provider  docusate sodium (COLACE) 100 MG capsule Take 1 capsule (100 mg total) by mouth every 12 (twelve) hours. 04/26/17   Deatra Canter, FNP  HYDROcodone-acetaminophen (NORCO/VICODIN) 5-325 MG tablet Take 1 tablet by mouth every 6 (six) hours as needed. 03/25/17   Dorena Bodo, NP    Family History No family history on file.  Social History Social History  Substance Use Topics  . Smoking status: Current Every Day Smoker  . Smokeless tobacco: Never Used  . Alcohol use No     Allergies   Patient has no known allergies.   Review of Systems Review of Systems  Constitutional: Negative for chills and fever.  Respiratory: Negative for shortness of breath.   Cardiovascular: Negative for chest pain.  Gastrointestinal: Negative for abdominal pain, blood in stool, constipation, diarrhea, nausea, rectal pain and vomiting.  Genitourinary: Positive for scrotal swelling and testicular pain. Negative for discharge, dysuria, frequency and hematuria.       +  weakened stream No dribbling or hesitancy  Musculoskeletal: Negative for arthralgias and myalgias.  Skin: Negative for color change.  Allergic/Immunologic: Negative for immunocompromised state.  Neurological: Negative for weakness and numbness.  Psychiatric/Behavioral: Negative for confusion.   All other systems  reviewed and are negative for acute change except as noted in the HPI.    Physical Exam Updated Vital Signs BP 120/88 (BP Location: Left Arm)   Pulse (!) 101   Temp 98.2 F (36.8 C) (Oral)   Resp 20   SpO2 100%   Physical Exam  Constitutional: He is oriented to person, place, and time. Vital signs are normal. He appears well-developed and well-nourished.  Non-toxic appearance. No distress.  Afebrile, nontoxic, NAD  HENT:  Head: Normocephalic and atraumatic.  Mouth/Throat: Oropharynx is clear and moist and mucous membranes are normal.  Eyes: Conjunctivae and EOM are normal. Right eye exhibits no discharge. Left eye exhibits no discharge.  Neck: Normal range of motion. Neck supple.  Cardiovascular: Normal rate, regular rhythm, normal heart sounds and intact distal pulses.  Exam reveals no gallop and no friction rub.   No murmur heard. Pulmonary/Chest: Effort normal and breath sounds normal. No respiratory distress. He has no decreased breath sounds. He has no wheezes. He has no rhonchi. He has no rales.  Abdominal: Soft. Normal appearance and bowel sounds are normal. He exhibits no distension. There is no tenderness. There is no rigidity, no rebound, no guarding, no CVA tenderness, no tenderness at McBurney's point and negative Murphy's sign. Hernia confirmed negative in the left inguinal area. Inguinal: unable to assess.  Soft, NTND, +BS throughout, no r/g/r, neg murphy's, neg mcburney's, no CVA TTP, no ventral hernia appreciated  Genitourinary: Penis normal. Right testis shows mass, swelling and tenderness. Left testis shows no mass, no swelling and no tenderness. Circumcised. No phimosis, paraphimosis, hypospadias, penile erythema or penile tenderness. No discharge found.  Genitourinary Comments: Chaperone present for exam Circumcised penis without phimosis/paraphimosis, hypospadias, erythema, tenderness, or discharge. No rashes or lesions. Right ?testicle appears to be swollen/enlarged  (vs mass on testicle?) to about 20 tall x 10 wide x 7 cm depth and is pushing the left testicle towards the posterior aspect of the scrotum, left testicle easily found and nontender to palpation and free of swelling. Right testicle not found, and is presumably the mass that is appreciated in the right scrotum; this area is hardened, no bowel sounds appreciated in the mass, and does not reduce when pt lays flat, nor is it reducible. Doesn't seem to be a hernia, but difficult to completely assess. Unable to probe R inguinal canal due to the mass/enlargement. No L sided inguinal hernias or adenopathy present.   Musculoskeletal: Normal range of motion.  Neurological: He is alert and oriented to person, place, and time. He has normal strength. No sensory deficit.  Skin: Skin is warm, dry and intact. No rash noted.  Psychiatric: He has a normal mood and affect.  Nursing note and vitals reviewed.    ED Treatments / Results  Labs (all labs ordered are listed, but only abnormal results are displayed) Labs Reviewed  COMPREHENSIVE METABOLIC PANEL - Abnormal; Notable for the following:       Result Value   Glucose, Bld 106 (*)    Calcium 8.8 (*)    ALT 14 (*)    All other components within normal limits  URINALYSIS, ROUTINE W REFLEX MICROSCOPIC - Abnormal; Notable for the following:    Protein, ur 30 (*)  Bacteria, UA RARE (*)    Squamous Epithelial / LPF 0-5 (*)    All other components within normal limits  URINE CULTURE  CBC WITH DIFFERENTIAL/PLATELET  RPR  HIV ANTIBODY (ROUTINE TESTING)  GC/CHLAMYDIA PROBE AMP ([place]) NOT AT [facility]    EKG  EKG Interpretation None       Radiology US Scrotum  Result Date: 07/19/2017 CLINICAL DATA:  Right testicular enlargement. EXAM: SCROTAL ULTRASOUND DOPPLER ULTRASOUND OF THE TESTICLES TECHNIQUE: Complete ultrasound examination of the testicles, epididymis, and other scrotal structures was performed. Color and spectral Doppler ultrasound were  also utilized to evaluate blood flow to the testicles. COMPARISON:  None. FINDINGS: Right testicle Measurements: 3.8 x 3.1 x 2.9 cm. No mass or microlithiasis visualized. Left testicle Measurements: 4.5 x 3.0 x 2.9 cm. No mass or microlithiasis visualized. Right epididymis:  Normal in size and appearance. Left epididymis: Normal in size and appearance. Small epididymal head cyst. Hydrocele: Large left greater than right simple appearing hydroceles. Varicocele:  None visualized. Pulsed Doppler interrogation of both testes demonstrates normal low resistance arterial and venous waveforms bilaterally. IMPRESSION: 1. Large left greater than right simple appearing hydroceles. Normal sonographic appearance of the bilateral testicles. Electronically Signed   By: Obie Dredge M.D.   On: 07/19/2017 13:22   [place] Art/ven Flow Abd Pelv Doppler  Result Date: 07/19/2017 CLINICAL DATA:  Right testicular enlargement. EXAM: SCROTAL ULTRASOUND DOPPLER ULTRASOUND OF THE TESTICLES TECHNIQUE: Complete ultrasound examination of the testicles, epididymis, and other scrotal structures was performed. Color and spectral Doppler ultrasound were also utilized to evaluate blood flow to the testicles. COMPARISON:  None. FINDINGS: Right testicle Measurements: 3.8 x 3.1 x 2.9 cm. No mass or microlithiasis visualized. Left testicle Measurements: 4.5 x 3.0 x 2.9 cm. No mass or microlithiasis visualized. Right epididymis:  Normal in size and appearance. Left epididymis: Normal in size and appearance. Small epididymal head cyst. Hydrocele: Large left greater than right simple appearing hydroceles. Varicocele:  None visualized. Pulsed Doppler interrogation of both testes demonstrates normal low resistance arterial and venous waveforms bilaterally. IMPRESSION: 1. Large left greater than right simple appearing hydroceles. Normal sonographic appearance of the bilateral testicles. Electronically Signed   By: Obie Dredge M.D.   On: 07/19/2017 13:22      Procedures Procedures (including critical care time)  Medications Ordered in ED Medications - No data to display   Initial Impression / Assessment and Plan / ED Course  I have reviewed the triage vital signs and the nursing notes.  Pertinent labs & imaging results that were available during my care of the patient were reviewed by me and considered in my medical decision making (see chart for details).     41 y.o. male here with right groin pain and swelling times several months that has been gradually worsening over the last 1 month. He was seen at urgent care 3 months ago and was told he had a hernia and to f/up with [facility], but never followed up due to financial reasons. On exam, it appears that his right testicle is swollen about 20cm x 10cm x 7cm and hardened, moderately tender, doesn't seem like this is a hernia as it doesn't have bowel sounds present and doesn't really seem to extend from the inguinal canal; left testicle easily appreciated on the opposite side of the scrotum and is nontender, but no right testicle felt aside from this enlarged mass that is presumably the testicle. Overlying skin without discoloration or changes. I'm concerned that he's either  got a growth/mass or that he had torsion and the testicle is edematous. Doubt this is a hernia, although still on the list of differentials. Abdomen benign. Will get labs, STD check, and U/S to further evaluate. Pt declines wanting anything for pain at this time. Will reassess shortly  3:21 PM CBC w/diff WNL. CMP essentially unremarkable. U/A with a few proteins, 0-5 squamous so somewhat contaminated, 0-5 WBCs and RBCs, but rare bacteria and mucus present; doubt UTI. U/S with large L>R simple hydroceles, otherwise unremarkable. This is odd considering the swelling is on the R side, and it seems to be pushing the left testicle posteriorly, but I guess it could be possible that the swelling on the left is just making it appear this way.  I asked that my attending Dr. Rubin Payor also see pt, given these unexpected results, and he saw pt and agrees with these results and the tx options. Discussed scrotal elevation, tighter underwear, tylenol/motrin for pain, and f/up with urology. Advised abstaining from sexual intercourse until he finds out the results of his STD testing, however doubt need for empiric tx. I explained the diagnosis and have given explicit precautions to return to the ER including for any other new or worsening symptoms. The patient understands and accepts the medical plan as it's been dictated and I have answered their questions. Discharge instructions concerning home care and prescriptions have been given. The patient is STABLE and is discharged to home in good condition.    Final Clinical Impressions(s) / ED Diagnoses   Final diagnoses:  Swelling of right testicle  Hydrocele in adult  Testicular pain, right    New Prescriptions New Prescriptions   No medications on [address], [place], [place] 07/19/17 1522    Benjiman Core, MD 07/19/17 [phone]

## 2017-07-19 NOTE — ED Notes (Signed)
Patient is on coumadin and aspirin 81 mg daily for AFIB.

## 2017-07-19 NOTE — Discharge Instructions (Signed)
Your symptoms are due to an accumulation of fluid within the scrotum. This accumulation can sometimes improve with elevating your scrotum, however sometimes this needs further intervention by a urologist. Please try to elevate your testicles as much as possible, and wear underwear that supports the testicles and scrotum. Alternate between tylenol and motrin as needed for pain. Please note, you were tested for STDs today, the lab will call you if anything shows on up on the testing, but do NOT have sexual intercourse until you know the results of your tests. All partners would need to be tested and treated for STDs before re-engaging in sexual activities. You can go to the health department for any STD concerns/treatment/etc. Follow up with the urologist in 5-7 days for recheck of symptoms and ongoing management of your condition. Return to the ER for emergent changes or worsening symptoms.

## 2017-07-20 LAB — URINE CULTURE: Culture: NO GROWTH

## 2017-07-20 LAB — GC/CHLAMYDIA PROBE AMP (~~LOC~~) NOT AT ARMC
Chlamydia: NEGATIVE
Neisseria Gonorrhea: NEGATIVE

## 2017-07-20 LAB — RPR: RPR: NONREACTIVE

## 2017-07-20 LAB — HIV ANTIBODY (ROUTINE TESTING W REFLEX): HIV Screen 4th Generation wRfx: NONREACTIVE

## 2017-11-21 ENCOUNTER — Emergency Department (HOSPITAL_COMMUNITY)
Admission: EM | Admit: 2017-11-21 | Discharge: 2017-11-21 | Disposition: A | Discharge status: Home / Self Care | Payer: Self-pay | Attending: Emergency Medicine | Admitting: Emergency Medicine

## 2017-11-21 ENCOUNTER — Encounter (HOSPITAL_COMMUNITY): Payer: Self-pay | Admitting: Emergency Medicine

## 2017-11-21 DIAGNOSIS — N50819 Testicular pain, unspecified: Secondary | ICD-10-CM | POA: Insufficient documentation

## 2017-11-21 DIAGNOSIS — Z5321 Procedure and treatment not carried out due to patient leaving prior to being seen by health care provider: Secondary | ICD-10-CM | POA: Insufficient documentation

## 2017-11-21 NOTE — ED Notes (Addendum)
Pt called x3 to be roomed with no response.  Inform by registration staff and other patients waiting in lobby that he had just left.  RN notified

## 2017-11-21 NOTE — ED Notes (Signed)
Bed: WA28 Expected date:  Expected time:  Means of arrival:  Comments: 

## 2017-11-21 NOTE — ED Triage Notes (Signed)
Patient c/o testicle pain and swelling "since August." Seen for same in August. States "it hurts, man, and they aren't doing anything to help me."

## 2017-11-21 NOTE — ED Notes (Signed)
NA x 1 when called in the lobby.

## 2017-11-21 NOTE — ED Notes (Signed)
Bed: WA29 Expected date:  Expected time:  Means of arrival:  Comments: 

## 2017-11-22 ENCOUNTER — Encounter (HOSPITAL_COMMUNITY): Payer: Self-pay | Admitting: *Deleted

## 2017-11-22 ENCOUNTER — Emergency Department (HOSPITAL_COMMUNITY): Payer: Self-pay

## 2017-11-22 ENCOUNTER — Emergency Department (HOSPITAL_COMMUNITY)
Admission: EM | Admit: 2017-11-22 | Discharge: 2017-11-22 | Disposition: A | Discharge status: Home / Self Care | Payer: Self-pay | Attending: Emergency Medicine | Admitting: Emergency Medicine

## 2017-11-22 DIAGNOSIS — N433 Hydrocele, unspecified: Secondary | ICD-10-CM | POA: Insufficient documentation

## 2017-11-22 DIAGNOSIS — N5082 Scrotal pain: Secondary | ICD-10-CM | POA: Insufficient documentation

## 2017-11-22 DIAGNOSIS — Z79899 Other long term (current) drug therapy: Secondary | ICD-10-CM | POA: Insufficient documentation

## 2017-11-22 DIAGNOSIS — F17210 Nicotine dependence, cigarettes, uncomplicated: Secondary | ICD-10-CM | POA: Insufficient documentation

## 2017-11-22 LAB — URINALYSIS, ROUTINE W REFLEX MICROSCOPIC
BILIRUBIN URINE: NEGATIVE
GLUCOSE, UA: NEGATIVE mg/dL
HGB URINE DIPSTICK: NEGATIVE
Ketones, ur: NEGATIVE mg/dL
Leukocytes, UA: NEGATIVE
Nitrite: NEGATIVE
PROTEIN: NEGATIVE mg/dL
Specific Gravity, Urine: 1.023 (ref 1.005–1.030)
pH: 6 (ref 5.0–8.0)

## 2017-11-22 NOTE — Discharge Instructions (Signed)
Please read instructions below. It is important to elevate your scrotum to help relief some of your symptoms.  You can take advil or tylenol as needed for pain. Call the urologist tomorrow to make an appointment. Mention your diagnosis of a hydrocele, and you can also mention that Dr. Al CorpusNarang is aware of you.  Return to the ER for fever, redness or increased warmth to your scrotum or new or concerning symptoms.

## 2017-11-22 NOTE — ED Notes (Signed)
Pt did not come when called in lobby for vitals to be updated x1.

## 2017-11-22 NOTE — ED Triage Notes (Signed)
Pt complains of scrotum pain/swelling since August. Pt has bee seen for this before but states he has not had a diagnosis or found relief. Pt denies discharge, difficulty urinating.

## 2017-11-22 NOTE — ED Provider Notes (Signed)
[facility]-EMERGENCY DEPT Provider Note   CSN: [id number] Arrival date & time: 11/22/17  1007     History   Chief Complaint Chief Complaint  Patient presents with  . Groin Pain    HPI Edward Bond is a 41 y.o. male w PMHx hydrocele, presenting to ED with persistent right sided scrotal pain that has been ongoing since before August of this year. Pt states the mass has been enlarging, and is very painful to ambulate, sit or do anything. Denies penile discharge, dysuria, urinary frequency, fever, abdominal pain, diarrhea, constipation. He is currently sexually active with male partner, no protection. Pt was last seen 07/19/2017 for this mass, worse scrotal ultrasound was done showing bilateral hydroceles.  STD panel was also done, with negative HIV, RPR, GC/chlamydia, and negative urine.  Patient was given urology referral, however states he does not have insurance and did not make an appointment.  He was also discharged with symptomatic management including scrotal support, however patient states it was uncomfortable.  The history is provided by the patient.    History reviewed. No pertinent past medical history.  There are no active problems to display for this patient.   History reviewed. No pertinent surgical history.     Home Medications    Prior to Admission medications   Medication Sig Start Date End Date Taking? Authorizing Provider  naproxen sodium (ALEVE) 220 MG tablet Take 440 mg by mouth daily as needed (BACK PAIN).   Yes [provider]  docusate sodium (COLACE) 100 MG capsule Take 1 capsule (100 mg total) by mouth every 12 (twelve) hours. Patient not taking: Reported on 11/22/2017 04/26/17   Deatra Canter, FNP  HYDROcodone-acetaminophen (NORCO/VICODIN) 5-325 MG tablet Take 1 tablet by mouth every 6 (six) hours as needed. Patient not taking: Reported on 11/22/2017 03/25/17   Dorena Bodo, NP    Family History No family  history on file.  Social History Social History   Tobacco Use  . Smoking status: Current Every Day Smoker  . Smokeless tobacco: Never Used  Substance Use Topics  . Alcohol use: No  . Drug use: Yes    Types: Cocaine, Marijuana     Allergies   Patient has no known allergies.   Review of Systems Review of Systems  Constitutional: Negative for fever.  Gastrointestinal: Negative for abdominal pain, constipation, diarrhea and nausea.  Genitourinary: Positive for scrotal swelling and testicular pain. Negative for discharge, dysuria, frequency and penile pain.  All other systems reviewed and are negative.    Physical Exam Updated Vital Signs BP 125/72 (BP Location: Left Arm)   Pulse 61   Temp 98.5 F (36.9 C) (Oral)   Resp 16   SpO2 100%   Physical Exam  Constitutional: He appears well-developed and well-nourished. No distress.  HENT:  Head: Normocephalic and atraumatic.  Eyes: Conjunctivae are normal.  Cardiovascular: Normal rate, regular rhythm, normal heart sounds and intact distal pulses.  Pulmonary/Chest: Effort normal and breath sounds normal. No respiratory distress.  Abdominal: Soft. Bowel sounds are normal. He exhibits no distension. There is no tenderness. There is no rebound and no guarding.  Genitourinary: Uncircumcised. No paraphimosis or penile tenderness. No discharge found.  Genitourinary Comments: Exam performed with chaperone present.  Significant right-sided mass in the right scrotum.  Scrotum without erythema or warmth.  Mass feels smooth and surface, with generalized mild tenderness throughout.  Unable to palpate right testicle or epididymis.  No palpated inguinal hernia, however difficult to trace  inguinal canal based on size of mass.  Left testicle palpated without abnormality or tenderness.  Neurological: He is alert.  Skin: Skin is warm.  Psychiatric: He has a normal mood and affect. His behavior is normal.  Nursing note and vitals  reviewed.    ED Treatments / Results  Labs (all labs ordered are listed, but only abnormal results are displayed) Labs Reviewed  URINE CULTURE  URINALYSIS, ROUTINE W REFLEX MICROSCOPIC    EKG  EKG Interpretation None       Radiology [place] Scrotum  Result Date: 11/22/2017 CLINICAL DATA:  41 year old male with right testicular pain x3 days. EXAM: ULTRASOUND OF SCROTUM TECHNIQUE: Complete ultrasound examination of the testicles, epididymis, and other scrotal structures was performed. COMPARISON:  None. FINDINGS: Right testicle Measurements: 5.2 x 3.6 x 4.4 cm. No mass or microlithiasis visualized. Left testicle Measurements: 4.6 x 2.9 x 3.6 cm. No mass or microlithiasis visualized. Right epididymis:  Normal in size and appearance. Left epididymis: There is a 3 mm hypoechoic focus in the left epididymis, likely an epidermal cyst. Hydrocele: There is a large left hydrocele with multiple septations, likely sequela of prior infection or hemorrhage. Clinical correlation is recommended. Varicocele:  None visualized. IMPRESSION: 1. Unremarkable testicles. 2. Large multiseptated left hydrocele, likely sequela of prior infection or hemorrhage. Electronically Signed   By: Elgie CollardArash  Radparvar M.D.   On: 11/22/2017 18:39   [place] Art/ven Flow Abd Pelv Doppler  Result Date: 11/22/2017 CLINICAL DATA:  41 year old male with right testicular pain x3 days. EXAM: ULTRASOUND OF SCROTUM TECHNIQUE: Complete ultrasound examination of the testicles, epididymis, and other scrotal structures was performed. COMPARISON:  None. FINDINGS: Right testicle Measurements: 5.2 x 3.6 x 4.4 cm. No mass or microlithiasis visualized. Left testicle Measurements: 4.6 x 2.9 x 3.6 cm. No mass or microlithiasis visualized. Right epididymis:  Normal in size and appearance. Left epididymis: There is a 3 mm hypoechoic focus in the left epididymis, likely an epidermal cyst. Hydrocele: There is a large left hydrocele with multiple septations, likely  sequela of prior infection or hemorrhage. Clinical correlation is recommended. Varicocele:  None visualized. IMPRESSION: 1. Unremarkable testicles. 2. Large multiseptated left hydrocele, likely sequela of prior infection or hemorrhage. Electronically Signed   By: Elgie CollardArash  Radparvar M.D.   On: 11/22/2017 18:39    Procedures Procedures (including critical care time)  Medications Ordered in ED Medications - No data to display   Initial Impression / Assessment and Plan / ED Course  I have reviewed the triage vital signs and the nursing notes.  Pertinent labs & imaging results that were available during my care of the patient were reviewed by me and considered in my medical decision making (see chart for details).  Clinical Course as of Nov 22 2322  Thu Nov 22, 2017  [id number] Spoke with Dr. Al CorpusNarang with urology. States they will see patient in clinic to schedule procedure. Also recommends scrotal support.  [JR]    Clinical Course User Index [JR] Vishaal Strollo, [place] N, PA-C   Presenting for subsequent visit with persistent scrotal pain and mass.  Patient seen in August of this year, with ultrasound showing hydrocele.  Patient denies any change in symptoms other than enlarging.  Has not followed up with urology.  No urinary symptoms, fever.  Workup in August revealed negative STD panel, UA and negative labs.  Exam today very large scrotal mass, without evidence of infection.  Repeat ultrasound done today showing persistent hydrocele, w interval enlargement.  Urology consulted, spoke with Dr.  Narang, who recommends scrotal support and follow-up outpatient in the clinic. Also discussed patient's financial difficulty as he is uninsured. Dr. Al CorpusNarang assured that pt would be seen, regardless of insurance status. Pt will likely need non-emergent procedural intervention to treat hydrocele given size and symptoms. Stressed interventions for scrotal support at home, OTC pain medications, and follow up with urology. Pt  agreed with plan. Safe for discharge.  Patient discussed with Dr. Criss AlvineGoldston.  Discussed results, findings, treatment and follow up. Patient advised of return precautions. Patient verbalized understanding and agreed with plan.   Final Clinical Impressions(s) / ED Diagnoses   Final diagnoses:  Hydrocele in adult    ED Discharge Orders    None       Matisha Termine, [place] N, PA-C 11/22/17 2323    Pricilla LovelessGoldston, Scott, MD 11/23/17 0010

## 2017-11-22 NOTE — ED Triage Notes (Signed)
Pt called to room; no response.  

## 2017-11-24 LAB — URINE CULTURE

## 2017-11-26 ENCOUNTER — Other Ambulatory Visit: Payer: Self-pay

## 2017-11-26 ENCOUNTER — Encounter (HOSPITAL_BASED_OUTPATIENT_CLINIC_OR_DEPARTMENT_OTHER): Payer: Self-pay | Admitting: *Deleted

## 2017-11-26 ENCOUNTER — Other Ambulatory Visit: Payer: Self-pay | Admitting: Urology

## 2017-11-26 NOTE — Progress Notes (Addendum)
SPOKE W/ PT VIA PHONE FOR PRE-OP INTERVIEW.  NPO AFTER MN.  ARRIVE AT 0745.  NEEDS HG  AND URINE DRUG SCREEN.  PT VERBALIZED UNDERSTANDING ABSOLUTELY NO DRUGS OF ANY TYPE STARTING NOW.  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    ADDENDUM:  CALLED AND SPOKE W/ DR OSSEY MDA VIA PHONE AND REVIEW CHART  DUE TO DRUG USE.  PER DR OSSEY MAKE SURE PT AWARE TO NOT DO ANY DRUGS AND WILL BE TESTED AND DETERMINED ON DOS BY MDA IF OK FOR SURGERY.

## 2017-11-26 NOTE — H&P (Signed)
Urology Preoperative H&P   Chief Complaint: Scrotal pain and enlargement  History of Present Illness: Edward Bond is a 41 y.o. male with a 1 month history of a progressively enlarging right hemiscrotum that presented approximately 1 month ago.  He states that the enlargement happened spontaneously and denies recent testicular trauma or surgery.  He states that he has a dull pain that is constant on the right hemiscrotum and is restricting his normal activities.  He had a scrotal ultrasound on 11/22/2017 that demonstrated a multiseptated large right hydrocele with no testicular lesions or signs of infection.    Past Medical History:  Diagnosis Date  . Drug abuse (HCC)    "coke" , marijuana  . History of bloody stools    per pt has seen blood in stool off and on per past year or so (2017-2018)  . Right hydrocele     Past Surgical History:  Procedure Laterality Date  . NO PAST SURGERIES      Allergies: No Known Allergies  History reviewed. No pertinent family history.  Social History:  reports that he has been smoking cigarettes.  He has a 20.25 pack-year smoking history. he has never used smokeless tobacco. He reports that he uses drugs. Drugs: Cocaine and Marijuana. He reports that he does not drink alcohol.  ROS: A complete review of systems was performed.  All systems are negative except for pertinent findings as noted.  Physical Exam:  Vital signs in last 24 hours: Weight:  [73.5 kg (162 lb)] 73.5 kg (162 lb) (12/31 1230) Constitutional:  Alert and oriented, No acute distress Cardiovascular: Regular rate and rhythm, No JVD Respiratory: Normal respiratory effort, Lungs clear bilaterally GI: Abdomen is soft, nontender, nondistended, no abdominal masses GU: No CVA tenderness Lymphatic: No lymphadenopathy Neurologic: Grossly intact, no focal deficits Psychiatric: Normal mood and affect  Laboratory Data:  No results for input(s): WBC, HGB, HCT, PLT in the last 72  hours.  No results for input(s): NA, K, CL, GLUCOSE, BUN, CALCIUM, CREATININE in the last 72 hours.  Invalid input(s): CO3   No results found for this or any previous visit (from the past 24 hour(s)). Recent Results (from the past 240 hour(s))  Urine culture     Status: Abnormal   Collection Time: 11/22/17  6:19 PM  Result Value Ref Range Status   Specimen Description URINE, CLEAN CATCH  Final   Special Requests NONE  Final   Culture (A)  Final    <10,000 COLONIES/mL INSIGNIFICANT GROWTH Performed at [facility], [address] [place], [place] [id number]    Report Status 11/24/2017 FINAL  Final    Renal Function: No results for input(s): CREATININE in the last 168 hours. CrCl cannot be calculated (Patient's most recent lab result is older than the maximum 21 days allowed.).  Radiologic Imaging: No results found.  I independently reviewed the above imaging studies.  Assessment and Plan Edward BeckwithRichard L Eisel is a 41 y.o. male with a large right hydrocele  -The risks, benefits and alternatives of right hydrocelectomy was discussed with the patient.  Risks include bleeding, wound infection, recurrence of his hydrocele, testicular atrophy and/or loss, chronic orchialgia and the inherent risk associated with general anesthesia.  He voices understanding and wishes to proceed.   Rhoderick Moodyhristopher Umberto Pavek, MD 11/26/2017, 5:00 PM  [facility] Pager: [phone]

## 2017-11-26 NOTE — H&P (View-Only) (Signed)
Urology Preoperative H&P   Chief Complaint: Scrotal pain and enlargement  History of Present Illness: Edward Bond is a 41 y.o. male with a 1 month history of a progressively enlarging right hemiscrotum that presented approximately 1 month ago.  He states that the enlargement happened spontaneously and denies recent testicular trauma or surgery.  He states that he has a dull pain that is constant on the right hemiscrotum and is restricting his normal activities.  He had a scrotal ultrasound on 11/22/2017 that demonstrated a multiseptated large right hydrocele with no testicular lesions or signs of infection.    Past Medical History:  Diagnosis Date  . Drug abuse (HCC)    "coke" , marijuana  . History of bloody stools    per pt has seen blood in stool off and on per past year or so (2017-2018)  . Right hydrocele     Past Surgical History:  Procedure Laterality Date  . NO PAST SURGERIES      Allergies: No Known Allergies  History reviewed. No pertinent family history.  Social History:  reports that he has been smoking cigarettes.  He has a 20.25 pack-year smoking history. he has never used smokeless tobacco. He reports that he uses drugs. Drugs: Cocaine and Marijuana. He reports that he does not drink alcohol.  ROS: A complete review of systems was performed.  All systems are negative except for pertinent findings as noted.  Physical Exam:  Vital signs in last 24 hours: Weight:  [73.5 kg (162 lb)] 73.5 kg (162 lb) (12/31 1230) Constitutional:  Alert and oriented, No acute distress Cardiovascular: Regular rate and rhythm, No JVD Respiratory: Normal respiratory effort, Lungs clear bilaterally GI: Abdomen is soft, nontender, nondistended, no abdominal masses GU: No CVA tenderness Lymphatic: No lymphadenopathy Neurologic: Grossly intact, no focal deficits Psychiatric: Normal mood and affect  Laboratory Data:  No results for input(s): WBC, HGB, HCT, PLT in the last 72  hours.  No results for input(s): NA, K, CL, GLUCOSE, BUN, CALCIUM, CREATININE in the last 72 hours.  Invalid input(s): CO3   No results found for this or any previous visit (from the past 24 hour(s)). Recent Results (from the past 240 hour(s))  Urine culture     Status: Abnormal   Collection Time: 11/22/17  6:19 PM  Result Value Ref Range Status   Specimen Description URINE, CLEAN CATCH  Final   Special Requests NONE  Final   Culture (A)  Final    <10,000 COLONIES/mL INSIGNIFICANT GROWTH Performed at [facility], [address] [place], [place] [address]    Report Status 11/24/2017 FINAL  Final    Renal Function: No results for input(s): CREATININE in the last 168 hours. CrCl cannot be calculated (Patient's most recent lab result is older than the maximum 21 days allowed.).  Radiologic Imaging: No results found.  I independently reviewed the above imaging studies.  Assessment and Plan Edward Bond is a 41 y.o. male with a large right hydrocele  -The risks, benefits and alternatives of right hydrocelectomy was discussed with the patient.  Risks include bleeding, wound infection, recurrence of his hydrocele, testicular atrophy and/or loss, chronic orchialgia and the inherent risk associated with general anesthesia.  He voices understanding and wishes to proceed.   Doneen Ollinger, MD 11/26/2017, 5:00 PM  [facility] Pager: [phone]  

## 2017-11-28 ENCOUNTER — Ambulatory Visit (HOSPITAL_BASED_OUTPATIENT_CLINIC_OR_DEPARTMENT_OTHER): Payer: Self-pay | Admitting: Certified Registered"

## 2017-11-28 ENCOUNTER — Encounter (HOSPITAL_BASED_OUTPATIENT_CLINIC_OR_DEPARTMENT_OTHER): Payer: Self-pay | Admitting: Certified Registered"

## 2017-11-28 ENCOUNTER — Ambulatory Visit (HOSPITAL_BASED_OUTPATIENT_CLINIC_OR_DEPARTMENT_OTHER)
Admission: RE | Admit: 2017-11-28 | Discharge: 2017-11-28 | Disposition: A | Discharge status: Home / Self Care | Payer: Self-pay | Source: Ambulatory Visit | Attending: Urology | Admitting: Urology

## 2017-11-28 ENCOUNTER — Encounter (HOSPITAL_BASED_OUTPATIENT_CLINIC_OR_DEPARTMENT_OTHER)
Admission: RE | Disposition: A | Discharge status: Home / Self Care | Payer: Self-pay | Source: Ambulatory Visit | Attending: Urology

## 2017-11-28 ENCOUNTER — Other Ambulatory Visit: Payer: Self-pay

## 2017-11-28 DIAGNOSIS — N433 Hydrocele, unspecified: Secondary | ICD-10-CM | POA: Insufficient documentation

## 2017-11-28 DIAGNOSIS — F17210 Nicotine dependence, cigarettes, uncomplicated: Secondary | ICD-10-CM | POA: Insufficient documentation

## 2017-11-28 DIAGNOSIS — Z538 Procedure and treatment not carried out for other reasons: Secondary | ICD-10-CM | POA: Insufficient documentation

## 2017-11-28 DIAGNOSIS — F1290 Cannabis use, unspecified, uncomplicated: Secondary | ICD-10-CM | POA: Insufficient documentation

## 2017-11-28 DIAGNOSIS — F1490 Cocaine use, unspecified, uncomplicated: Secondary | ICD-10-CM | POA: Insufficient documentation

## 2017-11-28 HISTORY — DX: Other specified health status: Z78.9

## 2017-11-28 HISTORY — DX: Personal history of other diseases of the digestive system: Z87.19

## 2017-11-28 HISTORY — DX: Hydrocele, unspecified: N43.3

## 2017-11-28 HISTORY — DX: Other psychoactive substance abuse, uncomplicated: F19.10

## 2017-11-28 LAB — RAPID URINE DRUG SCREEN, HOSP PERFORMED
AMPHETAMINES: NOT DETECTED
BENZODIAZEPINES: NOT DETECTED
Barbiturates: NOT DETECTED
Cocaine: POSITIVE — AB
Opiates: NOT DETECTED
TETRAHYDROCANNABINOL: POSITIVE — AB

## 2017-11-28 LAB — POCT HEMOGLOBIN-HEMACUE: HEMOGLOBIN: 14 g/dL (ref 13.0–17.0)

## 2017-11-28 SURGERY — HYDROCELECTOMY
Anesthesia: General | Laterality: Right

## 2017-11-28 MED ORDER — FENTANYL CITRATE (PF) 100 MCG/2ML IJ SOLN
INTRAMUSCULAR | Status: AC
  Filled 2017-11-28: qty 2

## 2017-11-28 MED ORDER — CEFAZOLIN SODIUM-DEXTROSE 2-4 GM/100ML-% IV SOLN
2.0000 g | Freq: Once | INTRAVENOUS | Status: DC
  Filled 2017-11-28: qty 100

## 2017-11-28 MED ORDER — LIDOCAINE 2% (20 MG/ML) 5 ML SYRINGE
INTRAMUSCULAR | Status: AC
  Filled 2017-11-28: qty 5

## 2017-11-28 MED ORDER — PROPOFOL 10 MG/ML IV BOLUS
INTRAVENOUS | Status: AC
  Filled 2017-11-28: qty 40

## 2017-11-28 MED ORDER — LACTATED RINGERS IV SOLN
INTRAVENOUS | Status: DC
  Administered 2017-11-28: 09:00:00 via INTRAVENOUS
  Filled 2017-11-28: qty 1000

## 2017-11-28 MED ORDER — CEFAZOLIN SODIUM-DEXTROSE 2-4 GM/100ML-% IV SOLN
INTRAVENOUS | Status: AC
  Filled 2017-11-28: qty 100

## 2017-11-28 MED ORDER — MIDAZOLAM HCL 2 MG/2ML IJ SOLN
INTRAMUSCULAR | Status: AC
  Filled 2017-11-28: qty 2

## 2017-11-28 SURGICAL SUPPLY — 23 items
BLADE HEX COATED 2.75 (ELECTRODE) ×1 IMPLANT
BLADE SURG 15 STRL LF DISP TIS (BLADE) ×1 IMPLANT
BLADE SURG 15 STRL SS (BLADE)
BNDG GAUZE ELAST 4 BULKY (GAUZE/BANDAGES/DRESSINGS) ×1 IMPLANT
COVER BACK TABLE 60X90IN (DRAPES) ×1 IMPLANT
COVER MAYO STAND STRL (DRAPES) ×1 IMPLANT
DRAPE LAPAROTOMY 100X72 PEDS (DRAPES) ×1 IMPLANT
ELECT REM PT RETURN 9FT ADLT (ELECTROSURGICAL)
ELECTRODE REM PT RTRN 9FT ADLT (ELECTROSURGICAL) ×1 IMPLANT
GLOVE BIO SURGEON STRL SZ7.5 (GLOVE) ×1 IMPLANT
GOWN STRL REUS W/TWL LRG LVL3 (GOWN DISPOSABLE) ×1 IMPLANT
NEEDLE HYPO 22GX1.5 SAFETY (NEEDLE) IMPLANT
NS IRRIG 500ML POUR BTL (IV SOLUTION) ×1 IMPLANT
PACK BASIN DAY SURGERY FS (CUSTOM PROCEDURE TRAY) ×1 IMPLANT
PENCIL BUTTON HOLSTER BLD 10FT (ELECTRODE) ×1 IMPLANT
SUPPORT SCROTAL LG STRP (MISCELLANEOUS) ×1 IMPLANT
SUT CHROMIC 3 0 SH 27 (SUTURE) ×2 IMPLANT
SUT VIC AB 2-0 UR5 27 (SUTURE) IMPLANT
SUT VICRYL 0 TIES 12 18 (SUTURE) IMPLANT
SYR CONTROL 10ML LL (SYRINGE) IMPLANT
TOWEL OR 17X24 6PK STRL BLUE (TOWEL DISPOSABLE) ×2 IMPLANT
TUBE CONNECTING 12X1/4 (SUCTIONS) ×1 IMPLANT
YANKAUER SUCT BULB TIP NO VENT (SUCTIONS) ×1 IMPLANT

## 2017-11-28 NOTE — Interval H&P Note (Signed)
History and Physical Interval Note:  11/28/2017 8:42 AM  Edward Bond  has presented today for surgery, with the diagnosis of RIGHT HYDROCELE  The various methods of treatment have been discussed with the patient and family. After consideration of risks, benefits and other options for treatment, the patient has consented to  Procedure(s): HYDROCELECTOMY ADULT (Right) as a surgical intervention .  The patient's history has been reviewed, patient examined, no change in status, stable for surgery.  I have reviewed the patient's chart and labs.  Questions were answered to the patient's satisfaction.     Dorian Furnacehristopher Aaron Winter

## 2017-11-28 NOTE — Progress Notes (Signed)
Dr. Desmond Lopeurk aware of positive drug screen. Surgery cancelled.

## 2017-11-28 NOTE — Anesthesia Preprocedure Evaluation (Addendum)
Anesthesia Evaluation  Patient identified by MRN, date of birth, ID band Patient awake    Reviewed: Allergy & Precautions, NPO status , Patient's Chart, lab work & pertinent test results  Airway Mallampati: II  TM Distance: >3 FB Neck ROM: Full    Dental  (+) Teeth Intact, Dental Advisory Given   Pulmonary Current Smoker,    Pulmonary exam normal breath sounds clear to auscultation       Cardiovascular negative cardio ROS Normal cardiovascular exam Rhythm:Regular Rate:Normal     Neuro/Psych negative neurological ROS     GI/Hepatic negative GI ROS, (+)     substance abuse  cocaine use and marijuana use,   Endo/Other  negative endocrine ROS  Renal/GU negative Renal ROS   Right hydrocele    Musculoskeletal negative musculoskeletal ROS (+)   Abdominal   Peds  Hematology negative hematology ROS (+)   Anesthesia Other Findings Day of surgery medications reviewed with the patient.  Reproductive/Obstetrics                             Anesthesia Physical Anesthesia Plan  ASA: II  Anesthesia Plan: General   Post-op Pain Management:    Induction: Intravenous  PONV Risk Score and Plan: 2 and Dexamethasone and Ondansetron  Airway Management Planned: LMA  Additional Equipment:   Intra-op Plan:   Post-operative Plan: Extubation in OR  Informed Consent: I have reviewed the patients History and Physical, chart, labs and discussed the procedure including the risks, benefits and alternatives for the proposed anesthesia with the patient or authorized representative who has indicated his/her understanding and acceptance.   Dental advisory given  Plan Discussed with: CRNA  Anesthesia Plan Comments: (UDS positive for cocaine.  Patient admitted to cocaine use 1 day ago.  Spoke with Dr. Liliane ShiWinter and agreed to cancel and reschedule.)       Anesthesia Quick Evaluation

## 2017-12-17 ENCOUNTER — Other Ambulatory Visit: Payer: Self-pay | Admitting: Urology

## 2017-12-21 ENCOUNTER — Other Ambulatory Visit: Payer: Self-pay

## 2017-12-21 ENCOUNTER — Encounter (HOSPITAL_COMMUNITY)
Admission: RE | Admit: 2017-12-21 | Discharge: 2017-12-21 | Disposition: A | Discharge status: Home / Self Care | Payer: Self-pay | Source: Ambulatory Visit | Attending: Urology | Admitting: Urology

## 2017-12-21 ENCOUNTER — Encounter (HOSPITAL_COMMUNITY): Payer: Self-pay

## 2017-12-21 DIAGNOSIS — N433 Hydrocele, unspecified: Secondary | ICD-10-CM | POA: Insufficient documentation

## 2017-12-21 DIAGNOSIS — Z01812 Encounter for preprocedural laboratory examination: Secondary | ICD-10-CM | POA: Insufficient documentation

## 2017-12-21 LAB — BASIC METABOLIC PANEL
Anion gap: 5 (ref 5–15)
BUN: 12 mg/dL (ref 6–20)
CO2: 27 mmol/L (ref 22–32)
Calcium: 9 mg/dL (ref 8.9–10.3)
Chloride: 106 mmol/L (ref 101–111)
Creatinine, Ser: 1.07 mg/dL (ref 0.61–1.24)
GFR calc Af Amer: >60 mL/min (ref >60)
GFR calc non Af Amer: >60 mL/min (ref >60)
Glucose, Bld: 81 mg/dL (ref 65–99)
POTASSIUM: 4.3 mmol/L (ref 3.5–5.1)
SODIUM: 138 mmol/L (ref 135–145)

## 2017-12-21 LAB — CBC
HEMATOCRIT: 42 % (ref 39.0–52.0)
HEMOGLOBIN: 14.3 g/dL (ref 13.0–17.0)
MCH: 32.2 pg (ref 26.0–34.0)
MCHC: 34 g/dL (ref 30.0–36.0)
MCV: 94.6 fL (ref 78.0–100.0)
Platelets: 291 10*3/uL (ref 150–400)
RBC: 4.44 MIL/uL (ref 4.22–5.81)
RDW: 14 % (ref 11.5–15.5)
WBC: 9.2 10*3/uL (ref 4.0–10.5)

## 2017-12-21 NOTE — Patient Instructions (Addendum)
Edward Bond  12/21/2017   Your procedure is scheduled on: 12-26-17   Report to [facility]  Entrance Report to Admitting at 8:15 AM   Call this number if you have problems the morning of surgery [phone]   Remember: Do not eat food or drink liquids :After Midnight.     Take these medicines the morning of surgery with A SIP OF WATER: None                                You may not have any metal on your body including hair pins and              piercings  Do not wear jewelry, lotions, powders or perfumes, deodorant             Men may shave face and neck.   Do not bring valuables to the hospital. [facility] IS NOT             RESPONSIBLE   FOR VALUABLES.  Contacts, dentures or bridgework may not be worn into surgery.     Patients discharged the day of surgery will not be allowed to drive home.  Name and phone number of your driver: Oris DroneLinda McDowell [phone]               Please read over the following fact sheets you were given: _____________________________________________________________________             [facility] - Preparing for Surgery Before surgery, you can play an important role.  Because skin is not sterile, your skin needs to be as free of germs as possible.  You can reduce the number of germs on your skin by washing with CHG (chlorahexidine gluconate) soap before surgery.  CHG is an antiseptic cleaner which kills germs and bonds with the skin to continue killing germs even after washing. Please DO NOT use if you have an allergy to CHG or antibacterial soaps.  If your skin becomes reddened/irritated stop using the CHG and inform your nurse when you arrive at Short Stay. Do not shave (including legs and underarms) for at least 48 hours prior to the first CHG shower.  You may shave your face/neck. Please follow these instructions carefully:  1.  Shower with CHG Soap the night before surgery and the  morning of Surgery.  2.  If  you choose to wash your hair, wash your hair first as usual with your  normal  shampoo.  3.  After you shampoo, rinse your hair and body thoroughly to remove the  shampoo.                           4.  Use CHG as you would any other liquid soap.  You can apply chg directly  to the skin and wash                       Gently with a scrungie or clean washcloth.  5.  Apply the CHG Soap to your body ONLY FROM THE NECK DOWN.   Do not use on face/ open                           Wound or open  sores. Avoid contact with eyes, ears mouth and genitals (private parts).                       Wash face,  Genitals (private parts) with your normal soap.             6.  Wash thoroughly, paying special attention to the area where your surgery  will be performed.  7.  Thoroughly rinse your body with warm water from the neck down.  8.  DO NOT shower/wash with your normal soap after using and rinsing off  the CHG Soap.                9.  Pat yourself dry with a clean towel.            10.  Wear clean pajamas.            11.  Place clean sheets on your bed the night of your first shower and do not  sleep with pets. Day of Surgery : Do not apply any lotions/deodorants the morning of surgery.  Please wear clean clothes to the hospital/surgery center.  FAILURE TO FOLLOW THESE INSTRUCTIONS MAY RESULT IN THE CANCELLATION OF YOUR SURGERY PATIENT SIGNATURE_________________________________  NURSE SIGNATURE__________________________________  ________________________________________________________________________

## 2017-12-21 NOTE — Progress Notes (Signed)
Pt's last surgery cancelled because of positive drug screening. Spoke to Dr. Arby BarretteHatchett, Anesthesiologist regarding the above. Dr. Arby BarretteHatchett would like drug screening done the day of surgery. Order placed

## 2017-12-25 ENCOUNTER — Other Ambulatory Visit: Payer: Self-pay | Admitting: Urology

## 2017-12-25 NOTE — Anesthesia Preprocedure Evaluation (Addendum)
Anesthesia Evaluation  Patient identified by MRN, date of birth, ID band Patient awake    Reviewed: Allergy & Precautions, NPO status , Patient's Chart, lab work & pertinent test results  Airway Mallampati: II  TM Distance: >3 FB Neck ROM: Full    Dental  (+) Dental Advisory Given, Teeth Intact,    Pulmonary Current Smoker,    breath sounds clear to auscultation       Cardiovascular negative cardio ROS   Rhythm:Regular Rate:Normal     Neuro/Psych negative neurological ROS     GI/Hepatic negative GI ROS, (+)     substance abuse (UDS negative today)  cocaine use and marijuana use,   Endo/Other  negative endocrine ROS  Renal/GU negative Renal ROS     Musculoskeletal   Abdominal   Peds  Hematology negative hematology ROS (+)   Anesthesia Other Findings   Reproductive/Obstetrics                          Anesthesia Physical Anesthesia Plan  ASA: I  Anesthesia Plan: General   Post-op Pain Management:    Induction: Intravenous  PONV Risk Score and Plan: 1 and Ondansetron, Dexamethasone and Treatment may vary due to age or medical condition  Airway Management Planned: LMA  Additional Equipment:   Intra-op Plan:   Post-operative Plan: Extubation in OR  Informed Consent: I have reviewed the patients History and Physical, chart, labs and discussed the procedure including the risks, benefits and alternatives for the proposed anesthesia with the patient or authorized representative who has indicated his/her understanding and acceptance.   Dental advisory given  Plan Discussed with: CRNA  Anesthesia Plan Comments:         Anesthesia Quick Evaluation

## 2017-12-25 NOTE — Progress Notes (Signed)
Spoke with:  Amar NPO:  After Midnight, no gum, candy, or mints   Arrival time: 8:15 AM Labs: Urine drug screen, CBC and BMP in epic 12/21/17 AM medications: None [facility]: [facility] Ride home:  Oris DroneLinda McDowell (aunt) [phone]

## 2017-12-26 ENCOUNTER — Ambulatory Visit (HOSPITAL_COMMUNITY)
Admission: RE | Admit: 2017-12-26 | Discharge: 2017-12-26 | Disposition: A | Discharge status: Home / Self Care | Payer: Self-pay | Source: Ambulatory Visit | Attending: Urology | Admitting: Urology

## 2017-12-26 ENCOUNTER — Ambulatory Visit (HOSPITAL_COMMUNITY): Payer: Self-pay | Admitting: Anesthesiology

## 2017-12-26 ENCOUNTER — Encounter (HOSPITAL_BASED_OUTPATIENT_CLINIC_OR_DEPARTMENT_OTHER): Payer: Self-pay | Admitting: Anesthesiology

## 2017-12-26 ENCOUNTER — Encounter (HOSPITAL_BASED_OUTPATIENT_CLINIC_OR_DEPARTMENT_OTHER)
Admission: RE | Disposition: A | Discharge status: Home / Self Care | Payer: Self-pay | Source: Ambulatory Visit | Attending: Urology

## 2017-12-26 DIAGNOSIS — N433 Hydrocele, unspecified: Secondary | ICD-10-CM | POA: Insufficient documentation

## 2017-12-26 DIAGNOSIS — F17210 Nicotine dependence, cigarettes, uncomplicated: Secondary | ICD-10-CM | POA: Insufficient documentation

## 2017-12-26 HISTORY — PX: HYDROCELE EXCISION: SHX482

## 2017-12-26 HISTORY — PX: ORCHIOPEXY: SHX479

## 2017-12-26 LAB — RAPID URINE DRUG SCREEN, HOSP PERFORMED
Amphetamines: NOT DETECTED
BARBITURATES: NOT DETECTED
Benzodiazepines: NOT DETECTED
COCAINE: NOT DETECTED
OPIATES: NOT DETECTED
Tetrahydrocannabinol: NOT DETECTED

## 2017-12-26 SURGERY — HYDROCELECTOMY
Anesthesia: General | Laterality: Right

## 2017-12-26 MED ORDER — ONDANSETRON HCL 4 MG/2ML IJ SOLN
INTRAMUSCULAR | Status: AC
  Filled 2017-12-26: qty 2

## 2017-12-26 MED ORDER — CEFAZOLIN SODIUM-DEXTROSE 2-4 GM/100ML-% IV SOLN
INTRAVENOUS | Status: AC
  Filled 2017-12-26: qty 100

## 2017-12-26 MED ORDER — LIDOCAINE 2% (20 MG/ML) 5 ML SYRINGE
INTRAMUSCULAR | Status: DC | PRN
  Administered 2017-12-26: 100 mg via INTRAVENOUS

## 2017-12-26 MED ORDER — HYDROMORPHONE HCL 1 MG/ML IJ SOLN
INTRAMUSCULAR | Status: AC
  Filled 2017-12-26: qty 1

## 2017-12-26 MED ORDER — HYDROMORPHONE HCL 1 MG/ML IJ SOLN
0.2500 mg | INTRAMUSCULAR | Status: DC | PRN
  Administered 2017-12-26 (×4): 0.5 mg via INTRAVENOUS
  Filled 2017-12-26: qty 0.5

## 2017-12-26 MED ORDER — KETOROLAC TROMETHAMINE 30 MG/ML IJ SOLN
INTRAMUSCULAR | Status: AC
  Filled 2017-12-26: qty 1

## 2017-12-26 MED ORDER — BUPIVACAINE HCL (PF) 0.25 % IJ SOLN
INTRAMUSCULAR | Status: DC | PRN
  Administered 2017-12-26: 9 mL

## 2017-12-26 MED ORDER — LACTATED RINGERS IV SOLN
INTRAVENOUS | Status: DC
  Administered 2017-12-26: 1000 mL via INTRAVENOUS
  Administered 2017-12-26: 12:00:00 via INTRAVENOUS
  Filled 2017-12-26: qty 1000

## 2017-12-26 MED ORDER — CEFAZOLIN SODIUM-DEXTROSE 2-4 GM/100ML-% IV SOLN
2.0000 g | Freq: Once | INTRAVENOUS | Status: AC
  Administered 2017-12-26: 2 g via INTRAVENOUS
  Filled 2017-12-26: qty 100

## 2017-12-26 MED ORDER — DEXAMETHASONE SODIUM PHOSPHATE 10 MG/ML IJ SOLN
INTRAMUSCULAR | Status: AC
  Filled 2017-12-26: qty 1

## 2017-12-26 MED ORDER — PROPOFOL 10 MG/ML IV BOLUS
INTRAVENOUS | Status: DC | PRN
  Administered 2017-12-26: 200 mg via INTRAVENOUS

## 2017-12-26 MED ORDER — BUPIVACAINE HCL (PF) 0.25 % IJ SOLN
INTRAMUSCULAR | Status: AC
  Filled 2017-12-26: qty 30

## 2017-12-26 MED ORDER — HYDROCODONE-ACETAMINOPHEN 5-325 MG PO TABS
1.0000 | ORAL_TABLET | Freq: Once | ORAL | Status: AC
Start: 2017-12-26 — End: 2017-12-26
  Administered 2017-12-26: 1 via ORAL
  Filled 2017-12-26: qty 1

## 2017-12-26 MED ORDER — CEFAZOLIN SODIUM-DEXTROSE 2-4 GM/100ML-% IV SOLN
2.0000 g | Freq: Once | INTRAVENOUS | Status: DC
  Filled 2017-12-26: qty 100

## 2017-12-26 MED ORDER — CEPHALEXIN 500 MG PO CAPS: 500.0000 mg | ORAL_CAPSULE | Freq: Three times a day (TID) | ORAL | 0 refills | Status: AC

## 2017-12-26 MED ORDER — DEXAMETHASONE SODIUM PHOSPHATE 10 MG/ML IJ SOLN
INTRAMUSCULAR | Status: DC | PRN
  Administered 2017-12-26: 10 mg via INTRAVENOUS

## 2017-12-26 MED ORDER — FENTANYL CITRATE (PF) 100 MCG/2ML IJ SOLN
INTRAMUSCULAR | Status: AC
  Filled 2017-12-26: qty 2

## 2017-12-26 MED ORDER — MIDAZOLAM HCL 2 MG/2ML IJ SOLN
INTRAMUSCULAR | Status: AC
  Filled 2017-12-26: qty 2

## 2017-12-26 MED ORDER — MIDAZOLAM HCL 2 MG/2ML IJ SOLN
INTRAMUSCULAR | Status: DC | PRN
  Administered 2017-12-26: 2 mg via INTRAVENOUS

## 2017-12-26 MED ORDER — DEXAMETHASONE SODIUM PHOSPHATE 10 MG/ML IJ SOLN
INTRAMUSCULAR | Status: AC
Start: 2017-12-26 — End: ?
  Filled 2017-12-26: qty 1

## 2017-12-26 MED ORDER — ONDANSETRON HCL 4 MG/2ML IJ SOLN
INTRAMUSCULAR | Status: DC | PRN
  Administered 2017-12-26: 4 mg via INTRAVENOUS

## 2017-12-26 MED ORDER — 0.9 % SODIUM CHLORIDE (POUR BTL) OPTIME
TOPICAL | Status: DC | PRN
  Administered 2017-12-26: 500 mL

## 2017-12-26 MED ORDER — PROMETHAZINE HCL 25 MG/ML IJ SOLN
6.2500 mg | INTRAMUSCULAR | Status: DC | PRN
  Filled 2017-12-26: qty 1

## 2017-12-26 MED ORDER — PROPOFOL 10 MG/ML IV BOLUS
INTRAVENOUS | Status: AC
Start: 2017-12-26 — End: ?
  Filled 2017-12-26: qty 20

## 2017-12-26 MED ORDER — HYDROCODONE-ACETAMINOPHEN 5-325 MG PO TABS: 1.0000 | ORAL_TABLET | ORAL | 0 refills | Status: DC | PRN

## 2017-12-26 MED ORDER — LIDOCAINE 2% (20 MG/ML) 5 ML SYRINGE
INTRAMUSCULAR | Status: AC
  Filled 2017-12-26: qty 5

## 2017-12-26 MED ORDER — HYDROCODONE-ACETAMINOPHEN 5-325 MG PO TABS
ORAL_TABLET | ORAL | Status: AC
  Filled 2017-12-26: qty 1

## 2017-12-26 MED ORDER — FENTANYL CITRATE (PF) 100 MCG/2ML IJ SOLN
INTRAMUSCULAR | Status: DC | PRN
  Administered 2017-12-26 (×2): 50 ug via INTRAVENOUS

## 2017-12-26 MED FILL — CEPHALEXIN 500 MG CAPSULE: 500 | 3 days supply | Qty: 9 | Fill #0

## 2017-12-26 MED FILL — HYDROCODON-APAP 5-325: 5-325 | 4 days supply | Qty: 20 | Fill #0

## 2017-12-26 SURGICAL SUPPLY — 32 items
ADH SKN CLS APL DERMABOND .7 (GAUZE/BANDAGES/DRESSINGS) ×1
BLADE HEX COATED 2.75 (ELECTRODE) ×3 IMPLANT
BLADE SURG 15 STRL LF DISP TIS (BLADE) ×1 IMPLANT
BLADE SURG 15 STRL SS (BLADE) ×3
BNDG GAUZE ELAST 4 BULKY (GAUZE/BANDAGES/DRESSINGS) ×3 IMPLANT
COVER BACK TABLE 60X90IN (DRAPES) ×3 IMPLANT
COVER MAYO STAND STRL (DRAPES) ×3 IMPLANT
DERMABOND ADVANCED (GAUZE/BANDAGES/DRESSINGS) ×2
DERMABOND ADVANCED .7 DNX12 (GAUZE/BANDAGES/DRESSINGS) IMPLANT
DRAIN PENROSE 18X1/2 LTX STRL (DRAIN) ×2 IMPLANT
DRAPE LAPAROTOMY 100X72 PEDS (DRAPES) ×3 IMPLANT
ELECT REM PT RETURN 9FT ADLT (ELECTROSURGICAL) ×3
ELECTRODE REM PT RTRN 9FT ADLT (ELECTROSURGICAL) ×1 IMPLANT
GLOVE BIOGEL M STRL SZ7.5 (GLOVE) ×3 IMPLANT
GLOVE BIOGEL PI IND STRL 7.5 (GLOVE) ×1 IMPLANT
GLOVE BIOGEL PI INDICATOR 7.5 (GLOVE) ×2
GOWN STRL REUS W/TWL LRG LVL3 (GOWN DISPOSABLE) ×3 IMPLANT
NEEDLE HYPO 22GX1.5 SAFETY (NEEDLE) ×2 IMPLANT
NS IRRIG 500ML POUR BTL (IV SOLUTION) ×3 IMPLANT
PACK BASIN DAY SURGERY FS (CUSTOM PROCEDURE TRAY) ×3 IMPLANT
PENCIL BUTTON HOLSTER BLD 10FT (ELECTRODE) ×3 IMPLANT
SUPPORT SCROTAL LG STRP (MISCELLANEOUS) ×2 IMPLANT
SUPPORTER ATHLETIC LG (MISCELLANEOUS) ×1
SUT CHROMIC 3 0 SH 27 (SUTURE) ×6 IMPLANT
SUT MNCRL AB 4-0 PS2 18 (SUTURE) ×2 IMPLANT
SUT VIC AB 2-0 UR5 27 (SUTURE) IMPLANT
SUT VICRYL 0 TIES 12 18 (SUTURE) IMPLANT
SYR CONTROL 10ML LL (SYRINGE) ×2 IMPLANT
TOWEL OR 17X24 6PK STRL BLUE (TOWEL DISPOSABLE) ×6 IMPLANT
TUBE CONNECTING 12'X1/4 (SUCTIONS) ×1
TUBE CONNECTING 12X1/4 (SUCTIONS) ×2 IMPLANT
YANKAUER SUCT BULB TIP NO VENT (SUCTIONS) ×3 IMPLANT

## 2017-12-26 NOTE — Anesthesia Postprocedure Evaluation (Signed)
Anesthesia Post Note  Patient: Dellia BeckwithRichard L Yett  Procedure(s) Performed: HYDROCELECTOMY ADULT (Right ) ORCHIOPEXY ADULT (Right )     Patient location during evaluation: PACU Anesthesia Type: General Level of consciousness: awake and alert Pain management: pain level controlled Vital Signs Assessment: post-procedure vital signs reviewed and stable Respiratory status: spontaneous breathing, nonlabored ventilation, respiratory function stable and patient connected to nasal cannula oxygen Cardiovascular status: blood pressure returned to baseline and stable Postop Assessment: no apparent nausea or vomiting Anesthetic complications: no    Last Vitals:  Vitals:   12/26/17 1145 12/26/17 1159  BP: (!) 131/94 (!) 137/91  Pulse: 60   Resp: (!) 9 14  Temp:    SpO2: 99%     Last Pain:  Vitals:   12/26/17 1145  TempSrc:   PainSc: 5                  Kennieth RadFitzgerald, Altan Kraai E

## 2017-12-26 NOTE — Op Note (Signed)
Operative Note  Preoperative diagnosis:  1.  Large right hydrocele  Postoperative diagnosis: 1.  Same  Procedure(s): 1.  Right hydrocelectomy 2.  Right orchiopexy  Surgeon: Rhoderick Moodyhristopher Marchello Rothgeb, MD  Assistants: None  Anesthesia: General LMA  Complications: None  EBL: 10 mL  Specimens: 1.  Right hydrocele sac  Drains/Catheters: 1.  None  Intraoperative findings: 1.  Large right hydrocele with approximately 1200 mL of fluid drained 2.  Very capacious right hemiscrotum  Indication:  Dellia BeckwithRichard L Zellmer is a 42 y.o. male with a 1 month history of a progressively enlarging right hemiscrotum that presented approximately 1 month ago.  He states that the enlargement happened spontaneously and denies recent testicular trauma or surgery.  He states that he has a dull pain that is constant on the right hemiscrotum and is restricting his normal activities.  He had a scrotal ultrasound on 11/22/2017 that demonstrated a multiseptated large right hydrocele with no testicular lesions or signs of infection.  Description of procedure:  After informed consent was obtained, the patient was brought to the operating room and general LMA anesthesia was administered.  The patient was placed in the supine position and prepped and draped in usual sterile fashion.  A timeout was then performed.  A 5 cm vertical incision was then made along the medial thigh and the underlying dartos and cremasteric tissue was incised using electrocautery.  The right hydrocele sac was soon identified and the investing tissue around it was dissected away.    The hydrocele sac was then grasped with 2 Allis clamps and sharply incised, immediately draining approximately 1200 mL of fluid.  The hydrocele sac was then further opened and inspection of the testicle revealed no abnormalities or lesions.  The hydrocele sac was then resected and inverted using the Jaboulay technique.  The right spermatic cord had a tremendous amount of  length and mobility to it.  He also had a very capacious right hemiscrotum secondary to the large amount of fluid that collected from his hydrocele.  Out of concern for right testicle torsion, a right orchiopexy was performed.  A 3-0 chromic suture was then placed on the lateral, medial and inferior aspects of the testicle and pexied to the adjacent dartos fascia.  The testicle was in the dependent portion of the right hemiscrotum and was in the correct anatomic orientation without any evidence of torsion.  The scrotum was then closed in 2 layers and dressed with Dermabond and scrotal support.  The patient tolerated the procedure well and was transferred to the postanesthesia unit in stable condition.  Plan: Follow-up in 2 weeks for wound check

## 2017-12-26 NOTE — Anesthesia Procedure Notes (Addendum)
Procedure Name: LMA Insertion Date/Time: 12/26/2017 10:04 AM Performed by: Tyrone NineSauve, Khiree Bukhari F, CRNA Pre-anesthesia Checklist: Patient identified, Emergency Drugs available, Suction available and Patient being monitored Patient Re-evaluated:Patient Re-evaluated prior to induction Oxygen Delivery Method: Circle system utilized Preoxygenation: Pre-oxygenation with 100% oxygen Induction Type: IV induction Ventilation: Mask ventilation without difficulty LMA: LMA inserted LMA Size: 4.0 Number of attempts: 1 Placement Confirmation: breath sounds checked- equal and bilateral,  CO2 detector and positive ETCO2 Tube secured with: Tape Dental Injury: Teeth and Oropharynx as per pre-operative assessment

## 2017-12-26 NOTE — Interval H&P Note (Signed)
History and Physical Interval Note:  12/26/2017 9:38 AM  Edward Bond  has presented today for surgery, with the diagnosis of RIGHT HYDROCELECTOMY  The various methods of treatment have been discussed with the patient and family. After consideration of risks, benefits and other options for treatment, the patient has consented to  Procedure(s) with comments: HYDROCELECTOMY ADULT (Right) - ONLY NEEDS 45 MIN FOR PROCEDURE as a surgical intervention .  The patient's history has been reviewed, patient examined, no change in status, stable for surgery.  I have reviewed the patient's chart and labs.  Questions were answered to the patient's satisfaction.     [name]

## 2017-12-26 NOTE — Transfer of Care (Signed)
Immediate Anesthesia Transfer of Care Note  Patient: Edward BeckwithRichard L Molloy  Procedure(s) Performed: HYDROCELECTOMY ADULT (Right ) ORCHIOPEXY ADULT (Right )  Patient Location: PACU  Anesthesia Type:General  Level of Consciousness: awake, alert  and oriented  Airway & Oxygen Therapy: Patient Spontanous Breathing and Patient connected to nasal cannula oxygen  Post-op Assessment: Report given to RN and Post -op Vital signs reviewed and stable  Post vital signs: Reviewed and stable  Last Vitals:  Vitals:   12/26/17 0844  BP: (!) 147/79  Pulse: 70  Resp: 16  Temp: 36.8 C  SpO2: 100%    Last Pain:  Vitals:   12/26/17 0844  TempSrc: Oral      Patients Stated Pain Goal: 6 (12/26/17 0900)  Complications: No apparent anesthesia complications

## 2017-12-26 NOTE — Discharge Instructions (Signed)
HOME CARE INSTRUCTIONS FOR [facility] PROCEDURES  Wound Care & Hygiene: You may apply an ice bag to the scrotum for the first 24 hours.  This may help decrease swelling and soreness.  You may have a dressing held in place by an athletic supporter.  You may remove the dressing in 24 hours and shower in 48 hours.  Continue to use the athletic supporter or tight briefs for at least a week. Activity: Rest today - not necessarily flat bed rest.  Just take it easy.  You should not do strenuous activities until your follow-up visit with your doctor.  You may resume light activity in 48 hours.  Return to Work:  Your doctor will advise you of this depending on the type of work you do  Diet: Drink liquids or eat a light diet this evening.  You may resume a regular diet tomorrow.  General Expectations: You may have a small amount of bleeding.  The scrotum may be swollen or bruised for about a week.  Call your Doctor if these occur:  -persistent or heavy bleeding  -temperature of 101 degrees or more  -severe pain, not relieved by your pain medication  Return to Doctor's Office:  Call to set up and appointment.   Post Anesthesia Home Care Instructions  Activity: Get plenty of rest for the remainder of the day. A responsible individual must stay with you for 24 hours following the procedure.  For the next 24 hours, DO NOT: -Drive a car -Operate machinery -Drink alcoholic beverages -Take any medication unless instructed by your physician -Make any legal decisions or sign important papers.  Meals: Start with liquid foods such as gelatin or soup. Progress to regular foods as tolerated. Avoid greasy, spicy, heavy foods. If nausea and/or vomiting occur, drink only clear liquids until the nausea and/or vomiting subsides. Call your physician if vomiting continues.  Special Instructions/Symptoms: Your throat may feel dry or sore from the anesthesia or the breathing tube placed in your throat during  surgery. If this causes discomfort, gargle with warm salt water. The discomfort should disappear within 24 hours.     

## 2017-12-28 ENCOUNTER — Encounter (HOSPITAL_BASED_OUTPATIENT_CLINIC_OR_DEPARTMENT_OTHER): Payer: Self-pay | Admitting: Urology

## 2020-04-30 ENCOUNTER — Emergency Department (HOSPITAL_COMMUNITY): Payer: Self-pay

## 2020-04-30 ENCOUNTER — Emergency Department (HOSPITAL_COMMUNITY)
Admission: EM | Admit: 2020-04-30 | Discharge: 2020-04-30 | Disposition: A | Discharge status: Home / Self Care | Payer: Self-pay | Attending: Emergency Medicine | Admitting: Emergency Medicine

## 2020-04-30 DIAGNOSIS — Y9384 Activity, sleeping: Secondary | ICD-10-CM | POA: Insufficient documentation

## 2020-04-30 DIAGNOSIS — S1193XA Puncture wound without foreign body of unspecified part of neck, initial encounter: Secondary | ICD-10-CM | POA: Insufficient documentation

## 2020-04-30 DIAGNOSIS — Y929 Unspecified place or not applicable: Secondary | ICD-10-CM | POA: Insufficient documentation

## 2020-04-30 DIAGNOSIS — Y999 Unspecified external cause status: Secondary | ICD-10-CM | POA: Insufficient documentation

## 2020-04-30 LAB — CBC
HCT: 43.9 % (ref 39.0–52.0)
Hemoglobin: 15 g/dL (ref 13.0–17.0)
MCH: 32.1 pg (ref 26.0–34.0)
MCHC: 34.2 g/dL (ref 30.0–36.0)
MCV: 94 fL (ref 80.0–100.0)
Platelets: 310 10*3/uL (ref 150–400)
RBC: 4.67 MIL/uL (ref 4.22–5.81)
RDW: 13.5 % (ref 11.5–15.5)
WBC: 9.9 10*3/uL (ref 4.0–10.5)
nRBC: 0 % (ref 0.0–0.2)

## 2020-04-30 LAB — BASIC METABOLIC PANEL
Anion gap: 16 — ABNORMAL HIGH (ref 5–15)
BUN: 6 mg/dL (ref 6–20)
CO2: 23 mmol/L (ref 22–32)
Calcium: 9.4 mg/dL (ref 8.9–10.3)
Chloride: 100 mmol/L (ref 98–111)
Creatinine, Ser: 1.07 mg/dL (ref 0.61–1.24)
GFR calc Af Amer: >60 mL/min (ref >60)
GFR calc non Af Amer: >60 mL/min (ref >60)
Glucose, Bld: 113 mg/dL — ABNORMAL HIGH (ref 70–99)
Potassium: 3.4 mmol/L — ABNORMAL LOW (ref 3.5–5.1)
Sodium: 139 mmol/L (ref 135–145)

## 2020-04-30 LAB — I-STAT CHEM 8, ED
BUN: 5 mg/dL — ABNORMAL LOW (ref 6–20)
Calcium, Ion: 1.15 mmol/L (ref 1.15–1.40)
Chloride: 101 mmol/L (ref 98–111)
Creatinine, Ser: 1 mg/dL (ref 0.61–1.24)
Glucose, Bld: 109 mg/dL — ABNORMAL HIGH (ref 70–99)
HCT: 45 % (ref 39.0–52.0)
Hemoglobin: 15.3 g/dL (ref 13.0–17.0)
Potassium: 3.4 mmol/L — ABNORMAL LOW (ref 3.5–5.1)
Sodium: 139 mmol/L (ref 135–145)
TCO2: 25 mmol/L (ref 22–32)

## 2020-04-30 MED ORDER — IOHEXOL 350 MG/ML SOLN
75.0000 mL | Freq: Once | INTRAVENOUS | Status: AC | PRN
  Administered 2020-04-30: 75 mL via INTRAVENOUS

## 2020-04-30 MED ORDER — SODIUM CHLORIDE 0.9 % IV BOLUS
1000.0000 mL | Freq: Once | INTRAVENOUS | Status: AC
  Administered 2020-04-30: 1000 mL via INTRAVENOUS

## 2020-04-30 MED ORDER — IOHEXOL 300 MG/ML  SOLN
75.0000 mL | Freq: Once | INTRAMUSCULAR | Status: AC | PRN
  Administered 2020-04-30: 75 mL via INTRAVENOUS
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
## 2020-04-30 NOTE — ED Notes (Signed)
Pt to CT via stretcher
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
## 2020-04-30 NOTE — Discharge Instructions (Addendum)
Local wound care with bacitracin and dressing changes twice daily.  Return to the emergency department for increased redness, pus draining from the wound, increased pain, or other new and concerning symptoms.
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
## 2020-04-30 NOTE — ED Notes (Signed)
Pt sitting up talking on telephone with normal speech and respiratory effort.  Will continue to monitor
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
## 2020-10-28 ENCOUNTER — Ambulatory Visit (HOSPITAL_COMMUNITY)
Admission: EM | Admit: 2020-10-28 | Discharge: 2020-10-28 | Disposition: A | Discharge status: Home / Self Care | Payer: Self-pay | Attending: Family Medicine | Admitting: Family Medicine

## 2020-10-28 ENCOUNTER — Encounter (HOSPITAL_COMMUNITY): Payer: Self-pay | Admitting: Emergency Medicine

## 2020-10-28 ENCOUNTER — Other Ambulatory Visit: Payer: Self-pay

## 2020-10-28 DIAGNOSIS — Z202 Contact with and (suspected) exposure to infections with a predominantly sexual mode of transmission: Secondary | ICD-10-CM | POA: Insufficient documentation

## 2020-10-28 MED ORDER — METRONIDAZOLE 500 MG PO TABS: 2000.0000 mg | ORAL_TABLET | Freq: Once | ORAL | 0 refills | Status: AC

## 2020-10-28 NOTE — ED Triage Notes (Signed)
Pt states he had a partner test positive for trichomoniasis yesterday. He states his urine has been cloudy.

## 2020-10-28 NOTE — ED Provider Notes (Signed)
[facility]    CSN: [id number] Arrival date & time: 10/28/20  1238      History   Chief Complaint Chief Complaint  Patient presents with  . Exposure to STD    HPI Edward Bond is a 44 y.o. male.   Here today requesting testing/treatment for trichomonas as his partner was recently positive for this and treated. Does note some cloudy urine and discharge but otherwise no rashes, pelvic pain, hematuria, fever, chills. Has not tried anything OTC for sxs.      Past Medical History:  Diagnosis Date  . Drug abuse (HCC)    "coke" , marijuana  . History of bloody stools    per pt has seen blood in stool off and on per past year or so (2017-2018)  . Medical history non-contributory   . Right hydrocele     There are no problems to display for this patient.   Past Surgical History:  Procedure Laterality Date  . HYDROCELE EXCISION Right 12/26/2017   Procedure: HYDROCELECTOMY ADULT;  Surgeon: Rene Paci, MD;  Location: [facility];  Service: Urology;  Laterality: Right;  ONLY NEEDS 45 MIN FOR PROCEDURE  . NO PAST SURGERIES    . ORCHIOPEXY Right 12/26/2017   Procedure: ORCHIOPEXY ADULT;  Surgeon: Rene Paci, MD;  [facility];  Service: Urology;  Laterality: Right;       Home Medications    Prior to Admission medications   Medication Sig Start Date End Date Taking? Authorizing Provider  HYDROcodone-acetaminophen (NORCO) 5-325 MG tablet Take 1 tablet by mouth every 4 (four) hours as needed for moderate pain. 12/26/17   Rene Paci, MD  metroNIDAZOLE (FLAGYL) 500 MG tablet Take 4 tablets (2,000 mg total) by mouth once for 1 dose. 10/28/20 10/28/20  Particia Nearing, PA-C    Family History Family History  Family history unknown: Yes    Social History Social History   Tobacco Use  . Smoking status: Current Every Day Smoker    Packs/day: 0.75    Years: 27.00    Pack  years: 20.25    Types: Cigarettes  . Smokeless tobacco: Never Used  . Tobacco comment: since age 52  Vaping Use  . Vaping Use: Never used  Substance Use Topics  . Alcohol use: No  . Drug use: Yes    Types: Cocaine, Marijuana    Comment: daily use marijuana;  Pt would not indicated the last time he used cocaine. Stated "I should be clean"     Allergies   Patient has no known allergies.   Review of Systems Review of Systems PER HPI    Physical Exam Triage Vital Signs ED Triage Vitals  Enc Vitals Group     BP 10/28/20 1339 117/76     Pulse Rate 10/28/20 1339 85     Resp --      Temp 10/28/20 1339 98.3 F (36.8 C)     Temp Source 10/28/20 1339 Oral     SpO2 10/28/20 1339 100 %     Weight --      Height --      Head Circumference --      Peak Flow --      Pain Score 10/28/20 1337 0     Pain Loc --      Pain Edu? --      Excl. in GC? --    No data found.  Updated Vital Signs BP 117/76  Pulse 85   Temp 98.3 F (36.8 C) (Oral)   SpO2 100%   Visual Acuity Right Eye Distance:   Left Eye Distance:   Bilateral Distance:    Right Eye Near:   Left Eye Near:    Bilateral Near:     Physical Exam Vitals and nursing note reviewed.  Constitutional:      Appearance: Normal appearance.  HENT:     Head: Atraumatic.  Eyes:     Extraocular Movements: Extraocular movements intact.     Conjunctiva/sclera: Conjunctivae normal.  Cardiovascular:     Rate and Rhythm: Normal rate and regular rhythm.  Pulmonary:     Effort: Pulmonary effort is normal.     Breath sounds: Normal breath sounds.  Abdominal:     General: Bowel sounds are normal. There is no distension.     Palpations: Abdomen is soft.     Tenderness: There is no abdominal tenderness. There is no right CVA tenderness, left CVA tenderness or guarding.  Genitourinary:    Comments: GU exam deferred, self swab performed Musculoskeletal:        General: Normal range of motion.     Cervical back: Normal range  of motion and neck supple.  Skin:    General: Skin is warm and dry.  Neurological:     General: No focal deficit present.     Mental Status: He is oriented to person, place, and time.  Psychiatric:        Mood and Affect: Mood normal.        Thought Content: Thought content normal.        Judgment: Judgment normal.     UC Treatments / Results  Labs (all labs ordered are listed, but only abnormal results are displayed) Labs Reviewed  CYTOLOGY, (ORAL, ANAL, URETHRAL) ANCILLARY ONLY    EKG   Radiology No results found.  Procedures Procedures (including critical care time)  Medications Ordered in UC Medications - No data to display  Initial Impression / Assessment and Plan / UC Course  I have reviewed the triage vital signs and the nursing notes.  Pertinent labs & imaging results that were available during my care of the patient were reviewed by me and considered in my medical decision making (see chart for details).     Cytology swab pending, flagyl sent to pharmacy given known exposure to trichomoniasis. Discussed abstinence for 1 week post treatment and until results return. Return precautions reviewed.   Final Clinical Impressions(s) / UC Diagnoses   Final diagnoses:  Exposure to STD   Discharge Instructions   None    ED Prescriptions    Medication Sig Dispense Auth. Provider   metroNIDAZOLE (FLAGYL) 500 MG tablet Take 4 tablets (2,000 mg total) by mouth once for 1 dose. 4 tablet Particia Nearing, [place]     PDMP not reviewed this encounter.   Particia Nearing, [place] 10/28/20 1453

## 2020-11-01 LAB — CYTOLOGY, (ORAL, ANAL, URETHRAL) ANCILLARY ONLY
Chlamydia: NEGATIVE
Comment: NEGATIVE
Comment: NEGATIVE
Comment: NORMAL
Neisseria Gonorrhea: NEGATIVE
Trichomonas: NEGATIVE

## 2023-09-13 ENCOUNTER — Encounter (HOSPITAL_COMMUNITY): Payer: Self-pay

## 2023-09-13 ENCOUNTER — Emergency Department (HOSPITAL_COMMUNITY): Payer: Commercial Managed Care - HMO

## 2023-09-13 ENCOUNTER — Other Ambulatory Visit: Payer: Self-pay

## 2023-09-13 ENCOUNTER — Emergency Department (HOSPITAL_COMMUNITY)
Admission: EM | Admit: 2023-09-13 | Discharge: 2023-09-13 | Disposition: A | Discharge status: Home / Self Care | Payer: Commercial Managed Care - HMO | Attending: Emergency Medicine | Admitting: Emergency Medicine

## 2023-09-13 DIAGNOSIS — R0789 Other chest pain: Secondary | ICD-10-CM | POA: Diagnosis present

## 2023-09-13 DIAGNOSIS — F1490 Cocaine use, unspecified, uncomplicated: Secondary | ICD-10-CM | POA: Diagnosis not present

## 2023-09-13 DIAGNOSIS — I159 Secondary hypertension, unspecified: Secondary | ICD-10-CM | POA: Diagnosis not present

## 2023-09-13 DIAGNOSIS — R079 Chest pain, unspecified: Secondary | ICD-10-CM

## 2023-09-13 LAB — BASIC METABOLIC PANEL
Anion gap: 11 (ref 5–15)
BUN: 8 mg/dL (ref 6–20)
CO2: 23 mmol/L (ref 22–32)
Calcium: 9 mg/dL (ref 8.9–10.3)
Chloride: 100 mmol/L (ref 98–111)
Creatinine, Ser: 0.87 mg/dL (ref 0.61–1.24)
GFR, Estimated: >60 mL/min (ref >60)
Glucose, Bld: 92 mg/dL (ref 70–99)
Potassium: 3.8 mmol/L (ref 3.5–5.1)
Sodium: 134 mmol/L — ABNORMAL LOW (ref 135–145)

## 2023-09-13 LAB — TROPONIN I (HIGH SENSITIVITY)
Troponin I (High Sensitivity): 4 ng/L (ref <18)
Troponin I (High Sensitivity): 4 ng/L (ref <18)

## 2023-09-13 LAB — CBC
HCT: 42.2 % (ref 39.0–52.0)
Hemoglobin: 14.4 g/dL (ref 13.0–17.0)
MCH: 31.5 pg (ref 26.0–34.0)
MCHC: 34.1 g/dL (ref 30.0–36.0)
MCV: 92.3 fL (ref 80.0–100.0)
Platelets: 338 10*3/uL (ref 150–400)
RBC: 4.57 MIL/uL (ref 4.22–5.81)
RDW: 13.9 % (ref 11.5–15.5)
WBC: 9.7 10*3/uL (ref 4.0–10.5)
nRBC: 0 % (ref 0.0–0.2)

## 2023-09-13 NOTE — ED Provider Notes (Signed)
[place] EMERGENCY DEPARTMENT AT [facility] Provider Note   CSN: [id number] Arrival date & time: 09/13/23  1310     History  Chief Complaint  Patient presents with   Chest Pain    Edward Bond is a 47 y.o. male.  Patient is a 47 yo male with pmh substance abuse and active tobacco use presenting for chest pain. Patient admits to central chest pain described as pressure-like, without radiation, constant, without nausea or diaphoresis, x 2 days. Denies fevers, chills, sob, or coughing. Admits to cocaine use.   The history is provided by the patient. No language interpreter was used.  Chest Pain Associated symptoms: no abdominal pain, no back pain, no cough, no fever, no palpitations, no shortness of breath and no vomiting        Home Medications Prior to Admission medications   Medication Sig Start Date End Date Taking? Authorizing Provider  HYDROcodone-acetaminophen (NORCO) 5-325 MG tablet Take 1 tablet by mouth every 4 (four) hours as needed for moderate pain. 12/26/17   Rene Paci, MD      Allergies    Patient has no known allergies.    Review of Systems   Review of Systems  Constitutional:  Negative for chills and fever.  HENT:  Negative for ear pain and sore throat.   Eyes:  Negative for pain and visual disturbance.  Respiratory:  Negative for cough and shortness of breath.   Cardiovascular:  Positive for chest pain. Negative for palpitations.  Gastrointestinal:  Negative for abdominal pain and vomiting.  Genitourinary:  Negative for dysuria and hematuria.  Musculoskeletal:  Negative for arthralgias and back pain.  Skin:  Negative for color change and rash.  Neurological:  Negative for seizures and syncope.  All other systems reviewed and are negative.   Physical Exam Updated Vital Signs BP (!) 145/110 (BP Location: Right Arm)   Pulse 100   Temp 98.2 F (36.8 C) (Oral)   Resp 13   Ht 6\' 4"  (1.93 m)   Wt 79.4 kg   SpO2 98%    BMI 21.30 kg/m  Physical Exam Vitals and nursing note reviewed.  Constitutional:      General: He is not in acute distress.    Appearance: He is well-developed.  HENT:     Head: Normocephalic and atraumatic.  Eyes:     Conjunctiva/sclera: Conjunctivae normal.  Cardiovascular:     Rate and Rhythm: Normal rate and regular rhythm.     Heart sounds: No murmur heard. Pulmonary:     Effort: Pulmonary effort is normal. No respiratory distress.     Breath sounds: Normal breath sounds.  Abdominal:     Palpations: Abdomen is soft.     Tenderness: There is no abdominal tenderness.  Musculoskeletal:        General: No swelling.     Cervical back: Neck supple.  Skin:    General: Skin is warm and dry.     Capillary Refill: Capillary refill takes less than 2 seconds.  Neurological:     Mental Status: He is alert.  Psychiatric:        Mood and Affect: Mood normal.     ED Results / Procedures / Treatments   Labs (all labs ordered are listed, but only abnormal results are displayed) Labs Reviewed  BASIC METABOLIC PANEL - Abnormal; Notable for the following components:      Result Value   Sodium 134 (*)    All other components within  normal limits  CBC  TROPONIN I (HIGH SENSITIVITY)  TROPONIN I (HIGH SENSITIVITY)    EKG EKG Interpretation Date/Time:  Thursday September 13 2023 13:15:47 EDT Ventricular Rate:  116 PR Interval:  142 QRS Duration:  88 QT Interval:  312 QTC Calculation: 433 R Axis:   104  Text Interpretation: Sinus tachycardia Right atrial enlargement Rightward axis Anterior infarct , age undetermined Abnormal ECG No previous ECGs available Confirmed by Edwin Dada ([id number]) on 09/13/2023 6:05:30 PM  Radiology DG Chest 2 View  Result Date: 09/13/2023 CLINICAL DATA:  Chest pain. EXAM: CHEST - 2 VIEW COMPARISON:  Chest radiograph 04/30/2020 FINDINGS: The cardiomediastinal silhouette is unchanged with normal heart size. No airspace consolidation, edema, pleural  effusion, pneumothorax is identified. No acute osseous abnormality is seen. IMPRESSION: No active cardiopulmonary disease. Electronically Signed   By: Sebastian Ache M.D.   On: 09/13/2023 15:27    Procedures Procedures    Medications Ordered in ED Medications - No data to display  ED Course/ Medical Decision Making/ A&P                                 Medical Decision Making  47 yo male with pmh substance abuse and active tobacco use presenting for chest pain.   The patient's chest pain is not suggestive of pulmonary embolus, cardiac ischemia, aortic dissection, pericarditis, myocarditis, pulmonary embolism, pneumothorax, pneumonia, Zoster, or esophageal perforation, or other serious etiology.  Historically not abrupt in onset, tearing or ripping, pulses symmetric. EKG nonspecific for ischemia/infarction. No dysrhythmias, brugada, WPW, prolonged QT noted. [CXR reviewed and WNL] [Troponin negative x2. CXR reviewed. Labs without demonstration of acute pathology unless otherwise noted above.   Chest pain likely secondary to cocaine.  Patient is hypertensive in ED but states he did a little bit of cocaine prior to arrival.  Will recommend outpatient follow-up with wellness clinic for blood pressure recheck when he has not done a little bit of cocaine.  Low HEART Score: 0-3 points (0.9-1.7% risk of MACE).] Given the extremely low risk of these diagnoses further testing and evaluation for these possibilities does not appear to be indicated at this time. Patient in no distress and overall condition improved here in the ED. Detailed discussions were had with the patient regarding current findings, and need for close f/u with PCP or on call doctor. The patient has been instructed to return immediately if the symptoms worsen in any way for re-evaluation. Patient verbalized understanding and is in agreement with current care plan. All questions answered prior to discharge.         Final Clinical  Impression(s) / ED Diagnoses Final diagnoses:  Chest pain, unspecified type  Cocaine use  Secondary hypertension    Rx / DC Orders ED Discharge Orders     None         Franne Forts, DO 09/13/23 1837

## 2023-09-13 NOTE — Discharge Instructions (Signed)
Today you presented for chest pain.  Your EKG and heart markers were stable indicating no myocardial infarction or heart attack.  Chest x-ray was stable.  I am concerned that your cocaine use is possibly causing your chest pain.  Decrease or discontinue use if possible.  You were also found to have high blood pressure today.  Please follow-up with the wellness clinic provided in the next 7 to 10 days for blood pressure recheck prior to cocaine use.

## 2023-09-13 NOTE — ED Triage Notes (Addendum)
Pt came to ED for CP that started 2 days ago. Pain does not radiate anywhere else. Axox4. Pt in no apparent distress at this time. Pt states he did cocaine yesterday.

## 2023-09-16 NOTE — Plan of Care (Signed)
[facility] Tonsillectomy/Adenoidectomy, Postoperative PEDS care plan entered in error.

## 2024-02-04 ENCOUNTER — Encounter (HOSPITAL_COMMUNITY): Payer: Self-pay | Admitting: Emergency Medicine

## 2024-02-04 ENCOUNTER — Emergency Department (HOSPITAL_COMMUNITY)
Admission: EM | Admit: 2024-02-04 | Discharge: 2024-02-04 | Disposition: A | Discharge status: Home / Self Care | Attending: Emergency Medicine | Admitting: Emergency Medicine

## 2024-02-04 ENCOUNTER — Other Ambulatory Visit: Payer: Self-pay

## 2024-02-04 DIAGNOSIS — D72829 Elevated white blood cell count, unspecified: Secondary | ICD-10-CM | POA: Diagnosis not present

## 2024-02-04 DIAGNOSIS — R112 Nausea with vomiting, unspecified: Secondary | ICD-10-CM | POA: Diagnosis present

## 2024-02-04 DIAGNOSIS — K529 Noninfective gastroenteritis and colitis, unspecified: Secondary | ICD-10-CM | POA: Insufficient documentation

## 2024-02-04 LAB — CBC WITH DIFFERENTIAL/PLATELET
Abs Immature Granulocytes: 0.09 10*3/uL — ABNORMAL HIGH (ref 0.00–0.07)
Basophils Absolute: 0.1 10*3/uL (ref 0.0–0.1)
Basophils Relative: 1 %
Eosinophils Absolute: 0.1 10*3/uL (ref 0.0–0.5)
Eosinophils Relative: 1 %
HCT: 47.4 % (ref 39.0–52.0)
Hemoglobin: 16.1 g/dL (ref 13.0–17.0)
Immature Granulocytes: 1 %
Lymphocytes Relative: 17 %
Lymphs Abs: 2.4 10*3/uL (ref 0.7–4.0)
MCH: 31.9 pg (ref 26.0–34.0)
MCHC: 34 g/dL (ref 30.0–36.0)
MCV: 94 fL (ref 80.0–100.0)
Monocytes Absolute: 1.5 10*3/uL — ABNORMAL HIGH (ref 0.1–1.0)
Monocytes Relative: 11 %
Neutro Abs: 9.6 10*3/uL — ABNORMAL HIGH (ref 1.7–7.7)
Neutrophils Relative %: 69 %
Platelets: 287 10*3/uL (ref 150–400)
RBC: 5.04 MIL/uL (ref 4.22–5.81)
RDW: 13.7 % (ref 11.5–15.5)
WBC: 13.7 10*3/uL — ABNORMAL HIGH (ref 4.0–10.5)
nRBC: 0 % (ref 0.0–0.2)

## 2024-02-04 LAB — COMPREHENSIVE METABOLIC PANEL
ALT: 13 U/L (ref 0–44)
AST: 20 U/L (ref 15–41)
Albumin: 3.7 g/dL (ref 3.5–5.0)
Alkaline Phosphatase: 45 U/L (ref 38–126)
Anion gap: 11 (ref 5–15)
BUN: 8 mg/dL (ref 6–20)
CO2: 27 mmol/L (ref 22–32)
Calcium: 8.9 mg/dL (ref 8.9–10.3)
Chloride: 102 mmol/L (ref 98–111)
Creatinine, Ser: 1.12 mg/dL (ref 0.61–1.24)
GFR, Estimated: >60 mL/min (ref >60)
Glucose, Bld: 104 mg/dL — ABNORMAL HIGH (ref 70–99)
Potassium: 3.1 mmol/L — ABNORMAL LOW (ref 3.5–5.1)
Sodium: 140 mmol/L (ref 135–145)
Total Bilirubin: 1.2 mg/dL (ref 0.0–1.2)
Total Protein: 7.4 g/dL (ref 6.5–8.1)

## 2024-02-04 LAB — RESP PANEL BY RT-PCR (RSV, FLU A&B, COVID)  RVPGX2
Influenza A by PCR: NEGATIVE
Influenza B by PCR: NEGATIVE
Resp Syncytial Virus by PCR: NEGATIVE
SARS Coronavirus 2 by RT PCR: NEGATIVE

## 2024-02-04 MED ORDER — LOPERAMIDE HCL 2 MG PO CAPS: 2.0000 mg | ORAL_CAPSULE | Freq: Four times a day (QID) | ORAL | 0 refills | Status: AC | PRN

## 2024-02-04 MED ORDER — ONDANSETRON 4 MG PO TBDP: 4.0000 mg | ORAL_TABLET | Freq: Three times a day (TID) | ORAL | 0 refills | Status: AC | PRN

## 2024-02-04 MED ORDER — ONDANSETRON 4 MG PO TBDP
4.0000 mg | ORAL_TABLET | Freq: Once | ORAL | Status: AC
  Administered 2024-02-04: 4 mg via ORAL
  Filled 2024-02-04: qty 1

## 2024-02-04 NOTE — Discharge Instructions (Signed)
 You were seen in emergency room for nausea, vomiting and diarrhea.  Your symptoms are consistent with gastroenteritis or GI bug.  Take Zofran as needed for nausea or vomiting.  Make sure he stay well-hydrated with primarily water but you can alternate Pedialyte and Gatorade.  Try bland foods like toast, crackers and applesauce.  If you continue have diarrhea you can try Pepto-Bismol or Imodium.  You can take 4 mg of Imodium.  If you have repeat diarrhea you can take 2 more milligrams.  Turn to emergency room if you have new or worsening symptoms.

## 2024-02-04 NOTE — ED Triage Notes (Signed)
 Pt in with abdominal cramping, n/v/d and congestion since yesterday. Reports at least 5 episodes of vomiting or diarrhea

## 2024-02-04 NOTE — ED Provider Notes (Signed)
  [place] EMERGENCY DEPARTMENT AT [facility] Provider Note   CSN: [id number] Arrival date & time: 02/04/24  0559     History {Add pertinent medical, surgical, social history, OB history to HPI:1} Chief Complaint  Patient presents with   N/V   Chills   Nasal Congestion    Edward Bond is a 48 y.o. male patient with past medical history of drug abuse presenting to emergency room with abdominal pain chills nausea vomiting and diarrhea. This started yesterday.   HPI     Home Medications Prior to Admission medications   Medication Sig Start Date End Date Taking? Authorizing Provider  HYDROcodone-acetaminophen (NORCO) 5-325 MG tablet Take 1 tablet by mouth every 4 (four) hours as needed for moderate pain. 12/26/17   Rene Paci, MD      Allergies    Patient has no known allergies.    Review of Systems   Review of Systems  Physical Exam Updated Vital Signs BP 105/82   Pulse 74   Temp 98.4 F (36.9 C) (Oral)   Resp 16   Wt 79.4 kg   SpO2 100%   BMI 21.31 kg/m  Physical Exam  ED Results / Procedures / Treatments   Labs (all labs ordered are listed, but only abnormal results are displayed) Labs Reviewed  COMPREHENSIVE METABOLIC PANEL - Abnormal; Notable for the following components:      Result Value   Potassium 3.1 (*)    Glucose, Bld 104 (*)    All other components within normal limits  CBC WITH DIFFERENTIAL/PLATELET - Abnormal; Notable for the following components:   WBC 13.7 (*)    Neutro Abs 9.6 (*)    Monocytes Absolute 1.5 (*)    Abs Immature Granulocytes 0.09 (*)    All other components within normal limits  RESP PANEL BY RT-PCR (RSV, FLU A&B, COVID)  [id number]    EKG None  Radiology No results found.  Procedures Procedures  {Document cardiac monitor, telemetry assessment procedure when appropriate:1}  Medications Ordered in ED Medications - No data to display  ED Course/ Medical Decision Making/ A&P   {   Click  here for ABCD2, HEART and other calculatorsREFRESH Note before signing :1}                              Medical Decision Making Amount and/or Complexity of Data Reviewed Labs: ordered.   ***  {Document critical care time when appropriate:1} {Document review of labs and clinical decision tools ie heart score, Chads2Vasc2 etc:1}  {Document your independent review of radiology images, and any outside records:1} {Document your discussion with family members, caretakers, and with consultants:1} {Document social determinants of health affecting pt's care:1} {Document your decision making why or why not admission, treatments were needed:1} Final Clinical Impression(s) / ED Diagnoses Final diagnoses:  None    Rx / DC Orders ED Discharge Orders     None

## 2024-10-29 ENCOUNTER — Emergency Department (HOSPITAL_COMMUNITY)
Admission: EM | Admit: 2024-10-29 | Discharge: 2024-10-29 | Disposition: A | Discharge status: Home / Self Care | Payer: Self-pay | Attending: Emergency Medicine | Admitting: Emergency Medicine

## 2024-10-29 ENCOUNTER — Emergency Department (HOSPITAL_COMMUNITY): Payer: Self-pay

## 2024-10-29 ENCOUNTER — Other Ambulatory Visit: Payer: Self-pay

## 2024-10-29 DIAGNOSIS — R300 Dysuria: Secondary | ICD-10-CM

## 2024-10-29 DIAGNOSIS — J069 Acute upper respiratory infection, unspecified: Secondary | ICD-10-CM | POA: Insufficient documentation

## 2024-10-29 DIAGNOSIS — F17210 Nicotine dependence, cigarettes, uncomplicated: Secondary | ICD-10-CM | POA: Insufficient documentation

## 2024-10-29 DIAGNOSIS — Z79899 Other long term (current) drug therapy: Secondary | ICD-10-CM | POA: Insufficient documentation

## 2024-10-29 DIAGNOSIS — M48061 Spinal stenosis, lumbar region without neurogenic claudication: Secondary | ICD-10-CM | POA: Insufficient documentation

## 2024-10-29 DIAGNOSIS — N281 Cyst of kidney, acquired: Secondary | ICD-10-CM | POA: Insufficient documentation

## 2024-10-29 LAB — URINALYSIS, ROUTINE W REFLEX MICROSCOPIC
Bilirubin Urine: NEGATIVE
Glucose, UA: NEGATIVE mg/dL
Hgb urine dipstick: NEGATIVE
Ketones, ur: NEGATIVE mg/dL
Leukocytes,Ua: NEGATIVE
Nitrite: NEGATIVE
Protein, ur: NEGATIVE mg/dL
Specific Gravity, Urine: 1.019 (ref 1.005–1.030)
pH: 7 (ref 5.0–8.0)

## 2024-10-29 LAB — CBC WITH DIFFERENTIAL/PLATELET
Abs Immature Granulocytes: 0.02 K/uL (ref 0.00–0.07)
Basophils Absolute: 0.1 K/uL (ref 0.0–0.1)
Basophils Relative: 1 %
Eosinophils Absolute: 0.1 K/uL (ref 0.0–0.5)
Eosinophils Relative: 2 %
HCT: 42.8 % (ref 39.0–52.0)
Hemoglobin: 14.4 g/dL (ref 13.0–17.0)
Immature Granulocytes: 0 %
Lymphocytes Relative: 31 %
Lymphs Abs: 1.9 K/uL (ref 0.7–4.0)
MCH: 31.7 pg (ref 26.0–34.0)
MCHC: 33.6 g/dL (ref 30.0–36.0)
MCV: 94.3 fL (ref 80.0–100.0)
Monocytes Absolute: 0.5 K/uL (ref 0.1–1.0)
Monocytes Relative: 8 %
Neutro Abs: 3.5 K/uL (ref 1.7–7.7)
Neutrophils Relative %: 58 %
Platelets: 308 K/uL (ref 150–400)
RBC: 4.54 MIL/uL (ref 4.22–5.81)
RDW: 13.7 % (ref 11.5–15.5)
WBC: 6 K/uL (ref 4.0–10.5)
nRBC: 0 % (ref 0.0–0.2)

## 2024-10-29 LAB — COMPREHENSIVE METABOLIC PANEL WITH GFR
ALT: 14 U/L (ref 0–44)
AST: 17 U/L (ref 15–41)
Albumin: 3.6 g/dL (ref 3.5–5.0)
Alkaline Phosphatase: 43 U/L (ref 38–126)
Anion gap: 9 (ref 5–15)
BUN: 8 mg/dL (ref 6–20)
CO2: 29 mmol/L (ref 22–32)
Calcium: 8.9 mg/dL (ref 8.9–10.3)
Chloride: 103 mmol/L (ref 98–111)
Creatinine, Ser: 0.94 mg/dL (ref 0.61–1.24)
GFR, Estimated: >60 mL/min (ref >60)
Glucose, Bld: 137 mg/dL — ABNORMAL HIGH (ref 70–99)
Potassium: 4.2 mmol/L (ref 3.5–5.1)
Sodium: 141 mmol/L (ref 135–145)
Total Bilirubin: 0.6 mg/dL (ref 0.0–1.2)
Total Protein: 7 g/dL (ref 6.5–8.1)

## 2024-10-29 LAB — RESP PANEL BY RT-PCR (RSV, FLU A&B, COVID)  RVPGX2
Influenza A by PCR: NEGATIVE
Influenza B by PCR: NEGATIVE
Resp Syncytial Virus by PCR: NEGATIVE
SARS Coronavirus 2 by RT PCR: NEGATIVE

## 2024-10-29 LAB — LIPASE, BLOOD: Lipase: 24 U/L (ref 11–51)

## 2024-10-29 MED ORDER — HYDROCODONE-ACETAMINOPHEN 5-325 MG PO TABS: 2.0000 | ORAL_TABLET | ORAL | 0 refills | Status: AC | PRN

## 2024-10-29 MED ORDER — KETOROLAC TROMETHAMINE 15 MG/ML IJ SOLN: 15.0000 mg | Freq: Once | INTRAMUSCULAR | Status: DC

## 2024-10-29 MED ORDER — LIDOCAINE 5 % EX PTCH
1.0000 | MEDICATED_PATCH | Freq: Once | CUTANEOUS | Status: DC
  Administered 2024-10-29: 1 via TRANSDERMAL
  Filled 2024-10-29: qty 1

## 2024-10-29 MED ORDER — DOXYCYCLINE HYCLATE 100 MG PO TABS: 100.0000 mg | ORAL_TABLET | Freq: Two times a day (BID) | ORAL | 0 refills | Status: AC

## 2024-10-29 MED ORDER — CEFTRIAXONE SODIUM 500 MG IJ SOLR
500.0000 mg | Freq: Once | INTRAMUSCULAR | Status: AC
  Administered 2024-10-29: 500 mg via INTRAMUSCULAR
  Filled 2024-10-29: qty 500

## 2024-10-29 MED ORDER — KETOROLAC TROMETHAMINE 15 MG/ML IJ SOLN
15.0000 mg | Freq: Once | INTRAMUSCULAR | Status: AC
  Administered 2024-10-29: 15 mg via INTRAMUSCULAR
  Filled 2024-10-29: qty 1

## 2024-10-29 MED ORDER — DOXYCYCLINE HYCLATE 100 MG PO TABS: 100.0000 mg | ORAL_TABLET | Freq: Two times a day (BID) | ORAL | 0 refills | Status: DC

## 2024-10-29 MED ORDER — CYCLOBENZAPRINE HCL 10 MG PO TABS: 10.0000 mg | ORAL_TABLET | Freq: Two times a day (BID) | ORAL | 0 refills | Status: AC | PRN

## 2024-10-29 MED ORDER — LIDOCAINE HCL (PF) 1 % IJ SOLN
1.0000 mL | Freq: Once | INTRAMUSCULAR | Status: AC
  Administered 2024-10-29: 1 mL
  Filled 2024-10-29: qty 5

## 2024-10-29 NOTE — ED Notes (Signed)
 Patient transported to X-ray

## 2024-10-29 NOTE — ED Provider Notes (Addendum)
 [place] EMERGENCY DEPARTMENT AT [facility] Provider Note  CSN: [id number] Arrival date & time: 10/29/24 9084  Chief Complaint(s) No chief complaint on file.  HPI Edward Bond is a 48 y.o. male with past medical history as below, significant for polysubstance abuse including cocaine and marijuana, hydrocele who presents to the ED with complaint of back pain/flank pain/ cough/ urinary complaint  Pt here w/ myriad of complaints.   1) pt reports fiancee told him that he gave her some sort of urinary infection, he reports some increased frequency but no dysuria, suprapubic pain, hematuria, or penile discharge. No fever 2) flank/back pain - pt reports right flank/back pain over the last few days, sharp/stabbing at times. Ambulatory. No trauma. No fever.  3) cough/uri symptoms - pt reports rhinorrhea, nonproductive cough, congestion over the last few days, +sick contacts. No fever, no vomiting but having occ nausea. Tolerating po    Past Medical History Past Medical History:  Diagnosis Date   Drug abuse (HCC)    coke , marijuana   History of bloody stools    per pt has seen blood in stool off and on per past year or so (2017-2018)   Medical history non-contributory    Right hydrocele    There are no active problems to display for this patient.  Home Medication(s) Prior to Admission medications   Medication Sig Start Date End Date Taking? Authorizing Provider  cyclobenzaprine (FLEXERIL) 10 MG tablet Take 1 tablet (10 mg total) by mouth 2 (two) times daily as needed for muscle spasms. 10/29/24  Yes Elnor Savant A, DO  HYDROcodone -acetaminophen  (NORCO/VICODIN) 5-325 MG tablet Take 2 tablets by mouth every 4 (four) hours as needed. 10/29/24  Yes Elnor Savant A, DO  doxycycline (VIBRA-TABS) 100 MG tablet Take 1 tablet (100 mg total) by mouth 2 (two) times daily. 10/29/24   Elnor Savant LABOR, DO  loperamide  (IMODIUM ) 2 MG capsule Take 1 capsule (2 mg total) by mouth 4 (four)  times daily as needed for diarrhea or loose stools. 02/04/24   Barrett, Jamie N, PA-C  ondansetron  (ZOFRAN -ODT) 4 MG disintegrating tablet Take 1 tablet (4 mg total) by mouth every 8 (eight) hours as needed for nausea or vomiting. 02/04/24   Barrett, Warren SAILOR, PA-C                                                                                                                                    Past Surgical History Past Surgical History:  Procedure Laterality Date   HYDROCELE EXCISION Right 12/26/2017   Procedure: HYDROCELECTOMY ADULT;  Surgeon: Devere Lonni Righter, MD;  Location: [facility];  Service: Urology;  Laterality: Right;  ONLY NEEDS 45 MIN FOR PROCEDURE   NO PAST SURGERIES     ORCHIOPEXY Right 12/26/2017   Procedure: ORCHIOPEXY ADULT;  Surgeon: Devere Lonni Righter, MD;  Location: [facility];  Service: Urology;  Laterality:  Right;   Family History Family History  Family history unknown: Yes    Social History Social History   Tobacco Use   Smoking status: Every Day    Current packs/day: 0.75    Average packs/day: 0.8 packs/day for 27.0 years (20.3 ttl pk-yrs)    Types: Cigarettes   Smokeless tobacco: Never   Tobacco comments:    since age 51  Vaping Use   Vaping status: Never Used  Substance Use Topics   Alcohol use: No   Drug use: Yes    Types: Cocaine, Marijuana    Comment: daily use marijuana;  Pt would not indicated the last time he used cocaine. Stated I should be clean   Allergies Patient has no known allergies.  Review of Systems A thorough review of systems was obtained and all systems are negative except as noted in the HPI and PMH.   Physical Exam Vital Signs  I have reviewed the triage vital signs BP 118/88 (BP Location: Left Arm)   Pulse 79   Temp 98.1 F (36.7 C) (Oral)   Resp 17   SpO2 98%  Physical Exam Vitals and nursing note reviewed.  Constitutional:      General: He is not in acute distress.     Appearance: He is well-developed.  HENT:     Head: Normocephalic and atraumatic.     Right Ear: External ear normal.     Left Ear: External ear normal.     Mouth/Throat:     Mouth: Mucous membranes are moist.  Eyes:     General: No scleral icterus. Cardiovascular:     Rate and Rhythm: Normal rate and regular rhythm.     Pulses: Normal pulses.     Heart sounds: Normal heart sounds.  Pulmonary:     Effort: Pulmonary effort is normal. No respiratory distress.     Breath sounds: Normal breath sounds.  Abdominal:     General: Abdomen is flat.     Palpations: Abdomen is soft.     Tenderness: There is no abdominal tenderness.  Musculoskeletal:     Cervical back: No rigidity.     Right lower leg: No edema.     Left lower leg: No edema.  Skin:    General: Skin is warm and dry.     Capillary Refill: Capillary refill takes less than 2 seconds.  Neurological:     Mental Status: He is alert and oriented to person, place, and time.     GCS: GCS eye subscore is 4. GCS verbal subscore is 5. GCS motor subscore is 6.     Cranial Nerves: No dysarthria or facial asymmetry.     Sensory: Sensation is intact.     Motor: Motor function is intact. No tremor.     Coordination: Coordination normal.     Gait: Gait is intact.  Psychiatric:        Mood and Affect: Mood normal.        Behavior: Behavior normal.     ED Results and Treatments Labs (all labs ordered are listed, but only abnormal results are displayed) Labs Reviewed  COMPREHENSIVE METABOLIC PANEL WITH GFR - Abnormal; Notable for the following components:      Result Value   Glucose, Bld 137 (*)    All other components within normal limits  RESP PANEL BY RT-PCR (RSV, FLU A&B, COVID)  RVPGX2  CBC WITH DIFFERENTIAL/PLATELET  LIPASE, BLOOD  URINALYSIS, ROUTINE W REFLEX MICROSCOPIC  GC/CHLAMYDIA PROBE AMP ([place])  NOT AT [facility]                                                                                                                           Radiology CT Renal Stone Study Result Date: 10/29/2024 CLINICAL DATA:  Right flank and lower back pain. EXAM: CT ABDOMEN AND PELVIS WITHOUT CONTRAST TECHNIQUE: Multidetector CT imaging of the abdomen and pelvis was performed following the standard protocol without IV contrast. RADIATION DOSE REDUCTION: This exam was performed according to the departmental dose-optimization program which includes automated exposure control, adjustment of the mA and/or kV according to patient size and/or use of iterative reconstruction technique. COMPARISON:  None Available. FINDINGS: Lower chest: No acute abnormality. Hepatobiliary: No focal liver abnormality is seen. No gallstones, gallbladder wall thickening, or biliary dilatation. Pancreas: Unremarkable. No pancreatic ductal dilatation or surrounding inflammatory changes. Spleen: Normal in size without focal abnormality. Adrenals/Urinary Tract: Normal adrenal glands. Kidneys demonstrate no hydronephrosis bilaterally. By ostia 1 cyst of the right kidney measures 1.8 cm and requires no follow-up. No urinary tract calculi identified. The bladder is decompressed. Stomach/Bowel: Bowel shows no evidence of obstruction, ileus, inflammation or lesion. The appendix is difficult to discretely visualize. No free intraperitoneal air. No significant diverticular disease. Vascular/Lymphatic: No significant vascular findings are present. No enlarged abdominal or pelvic lymph nodes. Reproductive: Prostate is unremarkable. Other: No abdominal wall hernia or abnormality. No abdominopelvic ascites. Musculoskeletal: No acute or significant osseous findings. IMPRESSION: 1. No acute findings in the abdomen or pelvis. No evidence of urinary tract calculi or hydronephrosis. 2. 1.8 cm right renal cyst requires no follow-up. Electronically Signed   By: Marcey Moan M.D.   On: 10/29/2024 11:53   CT L-SPINE NO CHARGE Result Date: 10/29/2024 EXAM: CT OF THE LUMBAR SPINE WITHOUT  CONTRAST 10/29/2024 11:11:00 AM TECHNIQUE: CT of the lumbar spine was performed without the administration of intravenous contrast. Multiplanar reformatted images are provided for review. Automated exposure control, iterative reconstruction, and/or weight based adjustment of the mA/kV was utilized to reduce the radiation dose to as low as reasonably achievable. COMPARISON: CT abdomen and pelvis reported separately today. CLINICAL HISTORY: 48 year old male with normal lumbar segmentation, mild congenital anterior wedging T12/L1, and no acute osseous abnormality. Intact sacrum/SI joints. Negative lumbar paraspinal soft tissues. FINDINGS: BONES AND ALIGNMENT: Normal lumbar segmentation. Mild congenital appearing anterior wedging of both T12 and L1. Maintained vertebral height otherwise. No acute osseous abnormality identified. Intact visible sacrum and SI joints. Normal alignment. No acute or inflammatory process identified in the lumbar spine. SOFT TISSUES: Negative non-contrast lumbar paraspinal soft tissues. DEGENERATIVE CHANGES: There is a degree of congenital lumbar spinal canal narrowing due to short pedicle distance, such as on series 6 image 58 at the L3 vertebral level. But generally mild lumbar spine degeneration and disc bulging. Combined, mild lumbar spinal stenosis is suspected at L2-L3 and L3-L4. At L4-L5 there is more advanced disc space loss. Circumferential disc bulge there with a broad based posterior component.  Combined suspected mild to moderate spinal stenosis (series 6 image 88). And moderate to severe bilateral L4 neural foraminal stenosis when combined with mild facet hypertrophy (probably greater on the left). At L5-S1 there is mild disc bulging in the congenital canal narrowing abates. IMPRESSION: 1. No acute no acute osseous abnormality in the lumbar spine. 2. 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 And significant L4 neural foraminal stenosis is suspected, left > right. 3. CT abdomen and pelvis reported separately. Electronically signed by: Helayne Hurst MD 10/29/2024 11:52 AM EST RP Workstation: [id number]   DG Chest 1 View Result Date: 10/29/2024 CLINICAL DATA:  10031 Cough 10031 EXAM: CHEST  1 VIEW COMPARISON:  09/13/2023 FINDINGS: No focal airspace consolidation, pleural effusion, or pneumothorax. No cardiomegaly.No acute fracture or destructive lesion. IMPRESSION: No acute cardiopulmonary abnormality. Electronically Signed   By: Rogelia Myers M.D.   On: 10/29/2024 11:00    Pertinent labs & imaging results that were available during my care of the patient were reviewed by me and considered in my medical decision making (see MDM for details).  Medications Ordered in ED Medications  lidocaine  (LIDODERM ) 5 % 1 patch (1 patch Transdermal Patch Applied 10/29/24 1105)  ketorolac  (TORADOL ) 15 MG/ML injection 15 mg (15 mg Intramuscular Given 10/29/24 1104)  cefTRIAXone  (ROCEPHIN ) injection 500 mg (500 mg Intramuscular Given 10/29/24 1306)  lidocaine  (PF) (XYLOCAINE ) 1 % injection 1-2.1 mL (1 mL Other Given 10/29/24 1308)                                                                                                                                     Procedures Procedures  (including critical care time)  Medical Decision Making / ED Course    Medical Decision Making:    KENTO GOSSMAN is a 48 y.o. male with past medical history as below, significant for polysubstance abuse including cocaine and marijuana, hydrocele who presents to the ED with complaint of back pain/flank pain/ cough/ urinary complaint. The complaint involves an extensive differential diagnosis and also carries with it a high risk of complications and morbidity.  Serious etiology was considered. Ddx includes but is not limited to: Differential diagnosis includes but is not exclusive to musculoskeletal back pain,  renal colic, urinary tract infection, pyelonephritis, intra-abdominal causes of back pain, aortic aneurysm or dissection, cauda equina syndrome, sciatica, lumbar disc disease, thoracic disc disease, cystitis, uti, urietc.   Complete initial physical exam performed, notably the patient was in nad, sitting upright.    Reviewed and confirmed nursing documentation for past medical history, family history, social history.  Vital signs reviewed.    Dysuria> - UA is stable, concern for possible STI.  Agreeable to empiric treatment.  Doxy for home, follow-up with GC chlamydia on MyChart  Low back pain> - Spinal stenosis noted on CT, he is ambulatory, neuro  intact.  Pain is well-controlled.  Vies follow-up with spine surgery  Cough/uri symptoms > -  cxr neg - rvp neg - not septic or hypoxia - favor viral syndrome - supportive care at home encouraged   Clinical Course as of 10/29/24 1319  Wed Oct 29, 2024  1227 CT L-SPINE NO CHARGE [SG]    Clinical Course User Index [SG] Elnor Jayson LABOR, DO    Patient presents with low back pain without signs of spinal cord compression, cauda equina syndrome, infection, aneurysm, or other serious etiology. The patient is neurologically intact. Given the extremely low risk of these diagnoses further testing and evaluation for these possibilities does not appear to be indicated at this time.   1:19 PM:  I have discussed the diagnosis/risks/treatment options with the patient.  Evaluation and diagnostic testing in the emergency department does not suggest an emergent condition requiring admission or immediate intervention beyond what has been performed at this time.  They will follow up with pcp/ nsgy. We also discussed returning to the ED immediately if new or worsening sx occur. We discussed the sx which are most concerning (e.g., sudden worsening pain, fever, inability to tolerate by mouth) that necessitate immediate return.    The patient appears reasonably  screened and/or stabilized for discharge and I doubt any other medical condition or other [facility] requiring further screening, evaluation, or treatment in the ED at this time prior to discharge.                 Additional history obtained: -Additional history obtained from na -External records from outside source obtained and reviewed including: Chart review including previous notes, labs, imaging, consultation notes including  Pdmp    Lab Tests: -I ordered, reviewed, and interpreted labs.   The pertinent results include:   Labs Reviewed  COMPREHENSIVE METABOLIC PANEL WITH GFR - Abnormal; Notable for the following components:      Result Value   Glucose, Bld 137 (*)    All other components within normal limits  RESP PANEL BY RT-PCR (RSV, FLU A&B, COVID)  RVPGX2  CBC WITH DIFFERENTIAL/PLATELET  LIPASE, BLOOD  URINALYSIS, ROUTINE W REFLEX MICROSCOPIC  GC/CHLAMYDIA PROBE AMP ([place]) NOT AT [facility]    Notable for labs stable  EKG   EKG Interpretation Date/Time:    Ventricular Rate:    PR Interval:    QRS Duration:    QT Interval:    QTC Calculation:   R Axis:      Text Interpretation:           Imaging Studies ordered: I ordered imaging studies including CTL CT renal, cxr I independently visualized the following imaging with scope of interpretation limited to determining acute life threatening conditions related to emergency care; findings noted above I agree with the radiologist interpretation If any imaging was obtained with contrast I closely monitored patient for any possible adverse reaction a/w contrast administration in the emergency department   Medicines ordered and prescription drug management: Meds ordered this encounter  Medications   lidocaine  (LIDODERM ) 5 % 1 patch   DISCONTD: ketorolac  (TORADOL ) 15 MG/ML injection 15 mg   ketorolac  (TORADOL ) 15 MG/ML injection 15 mg   cefTRIAXone  (ROCEPHIN ) injection 500 mg    Antibiotic Indication::    STD   lidocaine  (PF) (XYLOCAINE ) 1 % injection 1-2.1 mL   DISCONTD: doxycycline (VIBRA-TABS) 100 MG tablet    Sig: Take 1 tablet (100 mg total) by mouth 2 (two) times daily.    Dispense:  14 tablet    Refill:  0   HYDROcodone -acetaminophen  (NORCO/VICODIN) 5-325 MG tablet    Sig: Take 2 tablets by mouth every 4 (four) hours as needed.    Dispense:  10 tablet    Refill:  0   doxycycline (VIBRA-TABS) 100 MG tablet    Sig: Take 1 tablet (100 mg total) by mouth 2 (two) times daily.    Dispense:  14 tablet    Refill:  0   cyclobenzaprine (FLEXERIL) 10 MG tablet    Sig: Take 1 tablet (10 mg total) by mouth 2 (two) times daily as needed for muscle spasms.    Dispense:  20 tablet    Refill:  0    -I have reviewed the patients home medicines and have made adjustments as needed   Consultations Obtained: na   Cardiac Monitoring: Continuous pulse oximetry interpreted by myself, 98% on RA.    Social Determinants of Health:  Diagnosis or treatment significantly limited by social determinants of health: current smoker and polysubstance abuse Counseled patient for approximately 3 minutes regarding smoking cessation. Discussed risks of smoking and how they applied and affected their visit here today. Patient not ready to quit at this time, however will follow up with their primary doctor when they are.   CPT code: 00593: intermediate counseling for smoking cessation     Reevaluation: After the interventions noted above, I reevaluated the patient and found that they have improved  Co morbidities that complicate the patient evaluation  Past Medical History:  Diagnosis Date   Drug abuse (HCC)    coke , marijuana   History of bloody stools    per pt has seen blood in stool off and on per past year or so (2017-2018)   Medical history non-contributory    Right hydrocele       Dispostion: Disposition decision including need for hospitalization was considered, and patient discharged  from emergency department.    Final Clinical Impression(s) / ED Diagnoses Final diagnoses:  Renal cyst  Spinal stenosis of lumbar region, unspecified whether neurogenic claudication present  Dysuria  Viral URI with cough        Elnor Jayson LABOR, DO 10/29/24 1319    Elnor Jayson LABOR, DO 10/29/24 1319

## 2024-10-29 NOTE — Discharge Instructions (Addendum)
 It was a pleasure caring for you today in the emergency department.  A small cyst was noted on your kidney, this does not require any further workup at this time.  The results of your testing today will be available in MyChart.  Recommend you take antibiotics as prescribed and avoid sexual activity until you complete the antibiotics  Recommend you avoid illicit dtrughuse  Please return to the emergency department for any worsening or worrisome symptoms.

## 2024-10-29 NOTE — ED Triage Notes (Signed)
 Patient arrives with multiple complaints. States R lower back pain and neck pain since injury last year. No new injury. States he feels a tingle when he burns and said that a woman told him that he gave her a UTI or bacterial infection. Also reports L hamstring pain/cramping. Has tried excedrin for pain.

## 2024-10-30 LAB — GC/CHLAMYDIA PROBE AMP (~~LOC~~) NOT AT ARMC
Chlamydia: NEGATIVE
Comment: NEGATIVE
Comment: NORMAL
Neisseria Gonorrhea: NEGATIVE

## 2024-11-11 ENCOUNTER — Ambulatory Visit: Payer: Self-pay

## 2024-11-11 ENCOUNTER — Telehealth: Payer: Self-pay | Admitting: General Practice

## 2024-11-11 NOTE — Telephone Encounter (Signed)
 Called pt to reschedule missed appt; could not reach or leave vm
